# Patient Record
Sex: Female | Born: 1975 | Hispanic: No | Smoking: Current every day smoker
Health system: Southern US, Community
[De-identification: ages and names within clinical notes are randomized; demographics above are authoritative.]

## PROBLEM LIST (undated history)

## (undated) DIAGNOSIS — M1990 Unspecified osteoarthritis, unspecified site: Secondary | ICD-10-CM

## (undated) DIAGNOSIS — D649 Anemia, unspecified: Secondary | ICD-10-CM

## (undated) DIAGNOSIS — I10 Essential (primary) hypertension: Secondary | ICD-10-CM

## (undated) HISTORY — PX: WISDOM TOOTH EXTRACTION: SHX21

## (undated) HISTORY — PX: KNEE SURGERY: SHX244

---

## 1997-10-31 ENCOUNTER — Other Ambulatory Visit: Admission: RE | Admit: 1997-10-31 | Discharge: 1997-10-31 | Payer: Self-pay | Admitting: Obstetrics and Gynecology

## 1998-01-07 ENCOUNTER — Emergency Department (HOSPITAL_COMMUNITY): Admission: EM | Admit: 1998-01-07 | Discharge: 1998-01-07 | Payer: Self-pay | Admitting: Emergency Medicine

## 1998-01-09 ENCOUNTER — Encounter: Admission: RE | Admit: 1998-01-09 | Discharge: 1998-04-09 | Payer: Self-pay | Admitting: Internal Medicine

## 1998-10-22 ENCOUNTER — Inpatient Hospital Stay (HOSPITAL_COMMUNITY): Admission: AD | Admit: 1998-10-22 | Discharge: 1998-10-22 | Payer: Self-pay | Admitting: *Deleted

## 1998-11-01 ENCOUNTER — Emergency Department (HOSPITAL_COMMUNITY): Admission: EM | Admit: 1998-11-01 | Discharge: 1998-11-01 | Payer: Self-pay

## 1998-12-05 ENCOUNTER — Emergency Department (HOSPITAL_COMMUNITY): Admission: EM | Admit: 1998-12-05 | Discharge: 1998-12-05 | Payer: Self-pay | Admitting: Emergency Medicine

## 1999-05-20 ENCOUNTER — Inpatient Hospital Stay (HOSPITAL_COMMUNITY): Admission: AD | Admit: 1999-05-20 | Discharge: 1999-05-20 | Payer: Self-pay | Admitting: Obstetrics

## 1999-05-20 ENCOUNTER — Emergency Department (HOSPITAL_COMMUNITY): Admission: EM | Admit: 1999-05-20 | Discharge: 1999-05-21 | Payer: Self-pay | Admitting: Emergency Medicine

## 1999-05-20 ENCOUNTER — Encounter: Payer: Self-pay | Admitting: Emergency Medicine

## 1999-08-20 ENCOUNTER — Emergency Department (HOSPITAL_COMMUNITY): Admission: EM | Admit: 1999-08-20 | Discharge: 1999-08-20 | Payer: Self-pay | Admitting: Emergency Medicine

## 1999-12-22 ENCOUNTER — Emergency Department (HOSPITAL_COMMUNITY): Admission: EM | Admit: 1999-12-22 | Discharge: 1999-12-22 | Payer: Self-pay | Admitting: Emergency Medicine

## 1999-12-22 ENCOUNTER — Encounter: Payer: Self-pay | Admitting: Emergency Medicine

## 2000-12-07 ENCOUNTER — Emergency Department (HOSPITAL_COMMUNITY): Admission: EM | Admit: 2000-12-07 | Discharge: 2000-12-07 | Payer: Self-pay | Admitting: Emergency Medicine

## 2001-03-03 ENCOUNTER — Emergency Department (HOSPITAL_COMMUNITY): Admission: EM | Admit: 2001-03-03 | Discharge: 2001-03-03 | Payer: Self-pay | Admitting: Emergency Medicine

## 2001-03-30 ENCOUNTER — Other Ambulatory Visit: Admission: RE | Admit: 2001-03-30 | Discharge: 2001-03-30 | Payer: Self-pay | Admitting: Obstetrics and Gynecology

## 2001-06-14 ENCOUNTER — Emergency Department (HOSPITAL_COMMUNITY): Admission: EM | Admit: 2001-06-14 | Discharge: 2001-06-14 | Payer: Self-pay | Admitting: Emergency Medicine

## 2001-06-14 ENCOUNTER — Encounter: Payer: Self-pay | Admitting: Emergency Medicine

## 2001-06-15 ENCOUNTER — Emergency Department (HOSPITAL_COMMUNITY): Admission: EM | Admit: 2001-06-15 | Discharge: 2001-06-15 | Payer: Self-pay | Admitting: Emergency Medicine

## 2001-07-14 ENCOUNTER — Emergency Department (HOSPITAL_COMMUNITY): Admission: EM | Admit: 2001-07-14 | Discharge: 2001-07-14 | Payer: Self-pay | Admitting: Emergency Medicine

## 2001-09-17 ENCOUNTER — Emergency Department (HOSPITAL_COMMUNITY): Admission: EM | Admit: 2001-09-17 | Discharge: 2001-09-17 | Payer: Self-pay | Admitting: Emergency Medicine

## 2003-07-04 ENCOUNTER — Emergency Department (HOSPITAL_COMMUNITY): Admission: EM | Admit: 2003-07-04 | Discharge: 2003-07-04 | Payer: Self-pay | Admitting: Emergency Medicine

## 2004-09-26 ENCOUNTER — Emergency Department (HOSPITAL_COMMUNITY): Admission: EM | Admit: 2004-09-26 | Discharge: 2004-09-26 | Payer: Self-pay | Admitting: Emergency Medicine

## 2005-02-16 ENCOUNTER — Emergency Department (HOSPITAL_COMMUNITY): Admission: EM | Admit: 2005-02-16 | Discharge: 2005-02-16 | Payer: Self-pay | Admitting: Emergency Medicine

## 2005-03-01 ENCOUNTER — Emergency Department (HOSPITAL_COMMUNITY): Admission: EM | Admit: 2005-03-01 | Discharge: 2005-03-01 | Payer: Self-pay | Admitting: Family Medicine

## 2005-04-24 ENCOUNTER — Emergency Department (HOSPITAL_COMMUNITY): Admission: EM | Admit: 2005-04-24 | Discharge: 2005-04-24 | Payer: Self-pay | Admitting: Emergency Medicine

## 2007-08-06 ENCOUNTER — Emergency Department (HOSPITAL_COMMUNITY): Admission: EM | Admit: 2007-08-06 | Discharge: 2007-08-06 | Payer: Self-pay | Admitting: Emergency Medicine

## 2007-09-16 ENCOUNTER — Emergency Department: Payer: Self-pay | Admitting: Internal Medicine

## 2008-01-01 ENCOUNTER — Emergency Department: Payer: Self-pay | Admitting: Emergency Medicine

## 2008-01-03 ENCOUNTER — Emergency Department: Payer: Self-pay | Admitting: Internal Medicine

## 2008-01-24 ENCOUNTER — Ambulatory Visit: Payer: Self-pay | Admitting: Specialist

## 2008-02-24 ENCOUNTER — Ambulatory Visit: Payer: Self-pay | Admitting: Specialist

## 2008-04-06 ENCOUNTER — Emergency Department (HOSPITAL_COMMUNITY): Admission: EM | Admit: 2008-04-06 | Discharge: 2008-04-06 | Payer: Self-pay | Admitting: Emergency Medicine

## 2008-04-14 ENCOUNTER — Emergency Department (HOSPITAL_COMMUNITY): Admission: EM | Admit: 2008-04-14 | Discharge: 2008-04-14 | Payer: Self-pay | Admitting: Emergency Medicine

## 2008-09-28 ENCOUNTER — Emergency Department (HOSPITAL_COMMUNITY): Admission: EM | Admit: 2008-09-28 | Discharge: 2008-09-28 | Payer: Self-pay | Admitting: Emergency Medicine

## 2009-03-05 ENCOUNTER — Emergency Department (HOSPITAL_COMMUNITY): Admission: EM | Admit: 2009-03-05 | Discharge: 2009-03-05 | Payer: Self-pay | Admitting: Emergency Medicine

## 2009-03-24 ENCOUNTER — Emergency Department (HOSPITAL_COMMUNITY): Admission: EM | Admit: 2009-03-24 | Discharge: 2009-03-24 | Payer: Self-pay | Admitting: Emergency Medicine

## 2010-07-26 ENCOUNTER — Emergency Department (HOSPITAL_COMMUNITY)
Admission: EM | Admit: 2010-07-26 | Discharge: 2010-07-26 | Disposition: A | Discharge status: Home / Self Care | Payer: Self-pay | Attending: Emergency Medicine | Admitting: Emergency Medicine

## 2010-07-26 DIAGNOSIS — IMO0001 Reserved for inherently not codable concepts without codable children: Secondary | ICD-10-CM | POA: Insufficient documentation

## 2010-07-26 DIAGNOSIS — M25569 Pain in unspecified knee: Secondary | ICD-10-CM | POA: Insufficient documentation

## 2010-07-26 DIAGNOSIS — J3489 Other specified disorders of nose and nasal sinuses: Secondary | ICD-10-CM | POA: Insufficient documentation

## 2010-07-26 DIAGNOSIS — R509 Fever, unspecified: Secondary | ICD-10-CM | POA: Insufficient documentation

## 2010-07-26 DIAGNOSIS — R6883 Chills (without fever): Secondary | ICD-10-CM | POA: Insufficient documentation

## 2010-08-07 LAB — URINALYSIS, ROUTINE W REFLEX MICROSCOPIC
Bilirubin Urine: NEGATIVE
Bilirubin Urine: NEGATIVE
Glucose, UA: NEGATIVE mg/dL
Glucose, UA: NEGATIVE mg/dL
Hgb urine dipstick: NEGATIVE
Ketones, ur: NEGATIVE mg/dL
Ketones, ur: NEGATIVE mg/dL
Leukocytes, UA: NEGATIVE
Nitrite: NEGATIVE
Nitrite: NEGATIVE
Protein, ur: NEGATIVE mg/dL
Protein, ur: NEGATIVE mg/dL
Specific Gravity, Urine: 1.027 (ref 1.005–1.030)
Specific Gravity, Urine: 1.029 (ref 1.005–1.030)
Urobilinogen, UA: 2 mg/dL — ABNORMAL HIGH (ref 0.0–1.0)
Urobilinogen, UA: 1 mg/dL (ref 0.0–1.0)
pH: 6.5 (ref 5.0–8.0)
pH: 6 (ref 5.0–8.0)

## 2010-08-07 LAB — DIFFERENTIAL
Basophils Absolute: 0 10*3/uL (ref 0.0–0.1)
Basophils Relative: 0 % (ref 0–1)
Eosinophils Absolute: 0 10*3/uL (ref 0.0–0.7)
Eosinophils Relative: 1 % (ref 0–5)
Monocytes Absolute: 0.5 10*3/uL (ref 0.1–1.0)
Monocytes Relative: 17 % — ABNORMAL HIGH (ref 3–12)

## 2010-08-07 LAB — CBC
HCT: 31.8 % — ABNORMAL LOW (ref 36.0–46.0)
Hemoglobin: 11 g/dL — ABNORMAL LOW (ref 12.0–15.0)
MCHC: 34.6 g/dL (ref 30.0–36.0)
MCV: 82.6 fL (ref 78.0–100.0)
Platelets: 278 10*3/uL (ref 150–400)
RBC: 3.85 MIL/uL — ABNORMAL LOW (ref 3.87–5.11)
RDW: 14.3 % (ref 11.5–15.5)
WBC: 3 10*3/uL — ABNORMAL LOW (ref 4.0–10.5)

## 2010-08-07 LAB — URINE MICROSCOPIC-ADD ON

## 2010-08-07 LAB — POCT I-STAT, CHEM 8
BUN: 13 mg/dL (ref 6–23)
Calcium, Ion: 1.19 mmol/L (ref 1.12–1.32)
Chloride: 103 mEq/L (ref 96–112)
Creatinine, Ser: 0.5 mg/dL (ref 0.4–1.2)
Glucose, Bld: 95 mg/dL (ref 70–99)
HCT: 34 % — ABNORMAL LOW (ref 36.0–46.0)
Hemoglobin: 11.6 g/dL — ABNORMAL LOW (ref 12.0–15.0)
Potassium: 3.8 mEq/L (ref 3.5–5.1)
Sodium: 141 mEq/L (ref 135–145)
TCO2: 27 mmol/L (ref 0–100)

## 2010-08-07 LAB — POCT PREGNANCY, URINE: Preg Test, Ur: NEGATIVE

## 2010-08-07 LAB — GLUCOSE, CAPILLARY: Glucose-Capillary: 96 mg/dL (ref 70–99)

## 2010-10-01 ENCOUNTER — Emergency Department (HOSPITAL_COMMUNITY)
Admission: EM | Admit: 2010-10-01 | Discharge: 2010-10-02 | Disposition: A | Discharge status: Home / Self Care | Payer: Self-pay | Attending: Pediatric Emergency Medicine | Admitting: Pediatric Emergency Medicine

## 2010-10-01 DIAGNOSIS — D649 Anemia, unspecified: Secondary | ICD-10-CM | POA: Insufficient documentation

## 2010-10-01 DIAGNOSIS — R079 Chest pain, unspecified: Secondary | ICD-10-CM | POA: Insufficient documentation

## 2010-10-01 DIAGNOSIS — R209 Unspecified disturbances of skin sensation: Secondary | ICD-10-CM | POA: Insufficient documentation

## 2010-10-01 DIAGNOSIS — R0609 Other forms of dyspnea: Secondary | ICD-10-CM | POA: Insufficient documentation

## 2010-10-01 DIAGNOSIS — R0989 Other specified symptoms and signs involving the circulatory and respiratory systems: Secondary | ICD-10-CM | POA: Insufficient documentation

## 2010-10-01 DIAGNOSIS — E669 Obesity, unspecified: Secondary | ICD-10-CM | POA: Insufficient documentation

## 2010-10-02 ENCOUNTER — Emergency Department (HOSPITAL_COMMUNITY): Payer: Self-pay

## 2010-10-02 LAB — D-DIMER, QUANTITATIVE: D-Dimer, Quant: 0.53 ug/mL-FEU — ABNORMAL HIGH (ref 0.00–0.48)

## 2010-10-02 LAB — DIFFERENTIAL
Basophils Relative: 0 % (ref 0–1)
Eosinophils Absolute: 0 10*3/uL (ref 0.0–0.7)
Eosinophils Relative: 1 % (ref 0–5)
Monocytes Absolute: 0.3 10*3/uL (ref 0.1–1.0)
Monocytes Relative: 7 % (ref 3–12)
Neutro Abs: 1.5 10*3/uL — ABNORMAL LOW (ref 1.7–7.7)

## 2010-10-02 LAB — CBC
Hemoglobin: 10.8 g/dL — ABNORMAL LOW (ref 12.0–15.0)
MCH: 26.2 pg (ref 26.0–34.0)
MCHC: 33.2 g/dL (ref 30.0–36.0)
RDW: 13.7 % (ref 11.5–15.5)

## 2010-10-02 LAB — TROPONIN I: Troponin I: <0.3 ng/mL (ref <0.30)

## 2010-10-02 LAB — BASIC METABOLIC PANEL
BUN: 11 mg/dL (ref 6–23)
Chloride: 102 mEq/L (ref 96–112)
Potassium: 3 mEq/L — ABNORMAL LOW (ref 3.5–5.1)

## 2010-10-02 MED ORDER — IOHEXOL 350 MG/ML SOLN
100.0000 mL | Freq: Once | INTRAVENOUS | Status: AC | PRN
  Administered 2010-10-02: 100 mL via INTRAVENOUS

## 2011-02-06 LAB — RAPID STREP SCREEN (MED CTR MEBANE ONLY): Streptococcus, Group A Screen (Direct): NEGATIVE

## 2011-03-31 IMAGING — CT CT HEAD W/O CM
1 of 2 series · 13 of 30 positions shown, 17 images · non-contrast
Comparison: None

CLINICAL DATA: Pain and dizziness

CT HEAD WITHOUT CONTRAST
TECHNIQUE: Contiguous axial images were obtained from the base of
the skull through the vertex without contrast.

[Series 2: brain · axial · 0.47mm/px · z∈[+127,+252]mm · 13 of 28 slices shown, 17 images]
[im 2/28  brain]
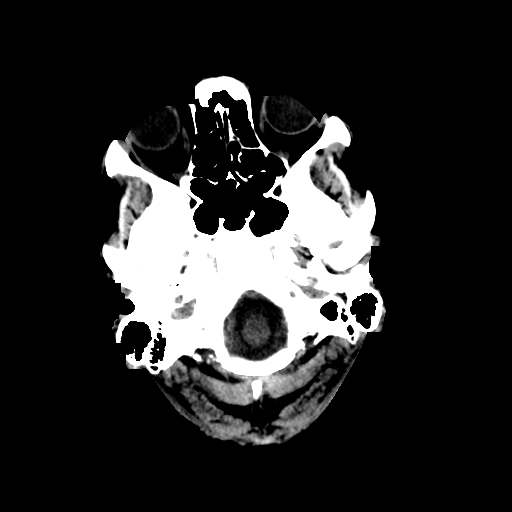
[im 2/28  bone]
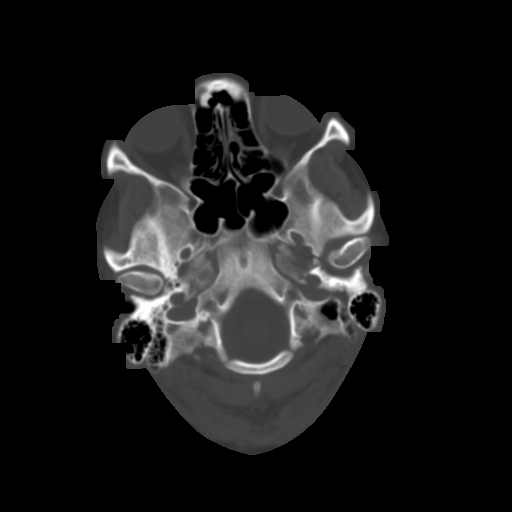
[im 4/28  brain]
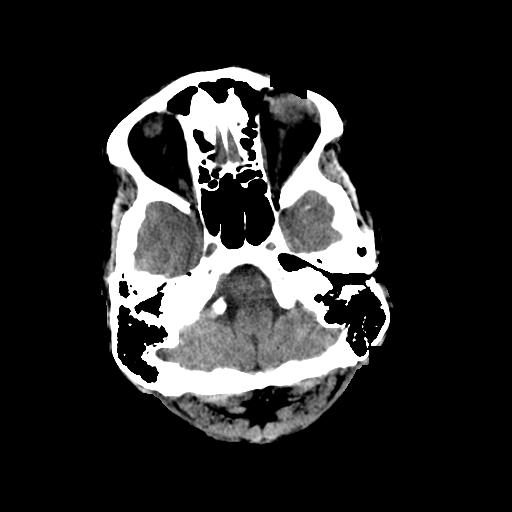
[im 6/28  brain]
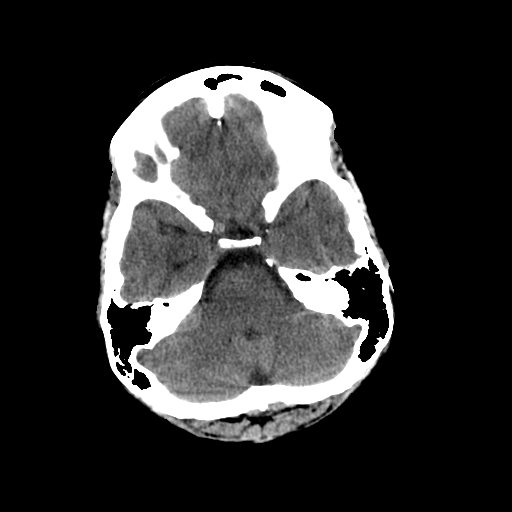
[im 8/28  brain]
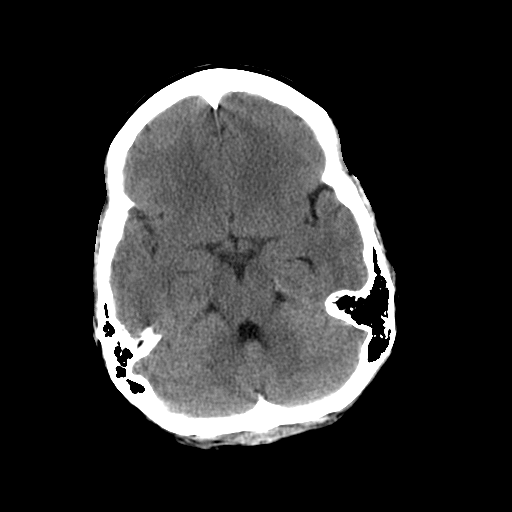
[im 10/28  brain]
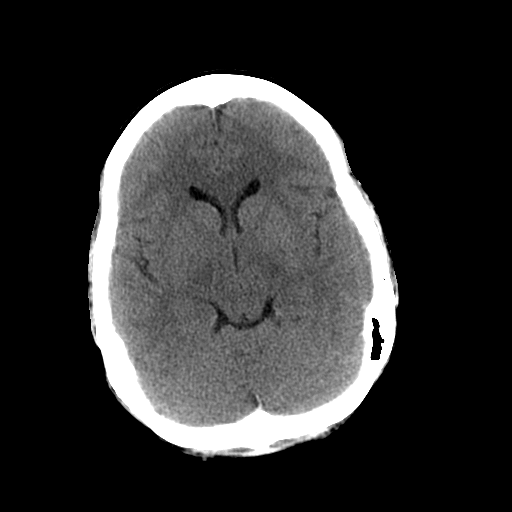
[im 10/28  bone]
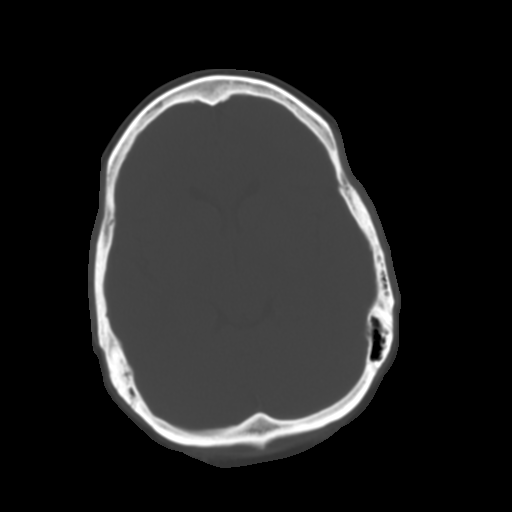
[im 12/28  brain]
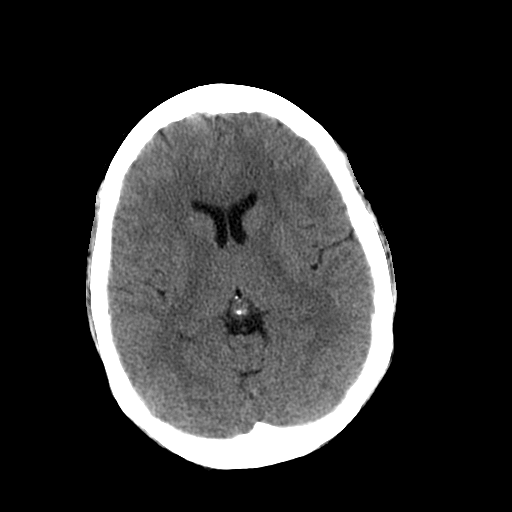
[im 14/28  brain]
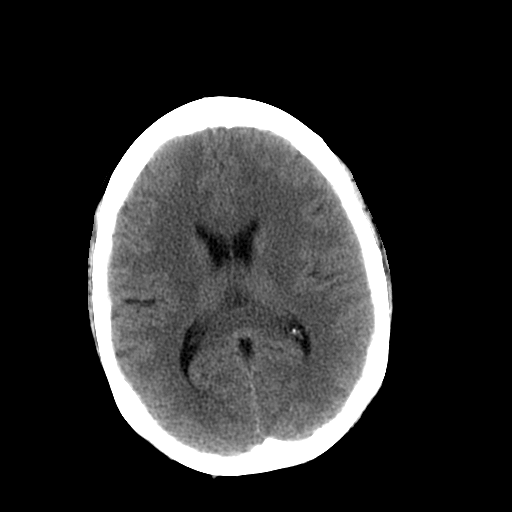
[im 16/28  brain]
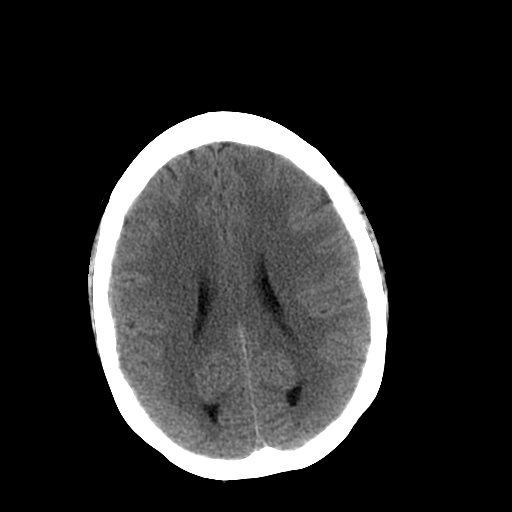
[im 18/28  brain]
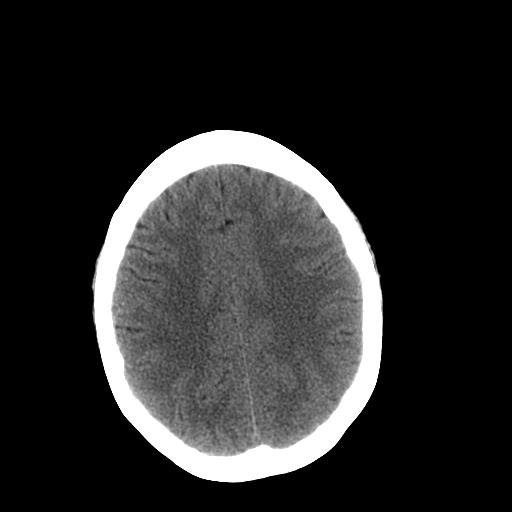
[im 18/28  bone]
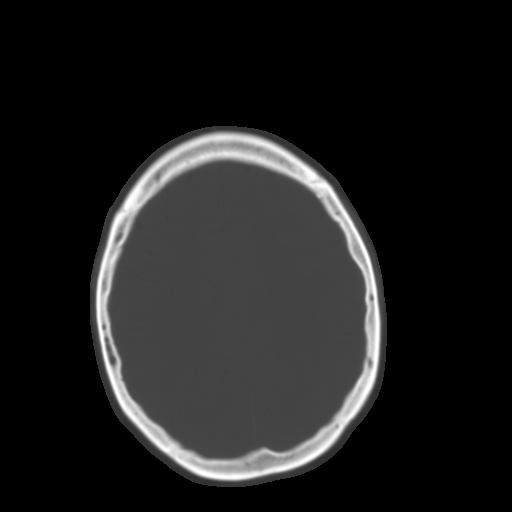
[im 20/28  brain]
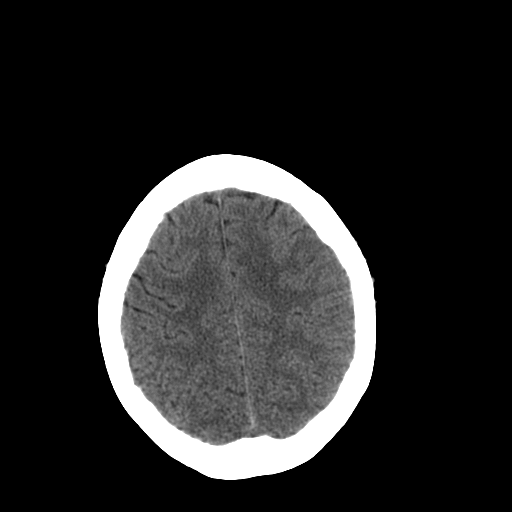
[im 22/28  brain]
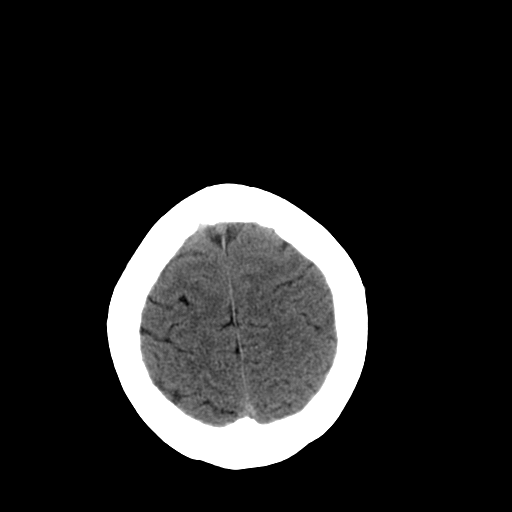
[im 24/28  brain]
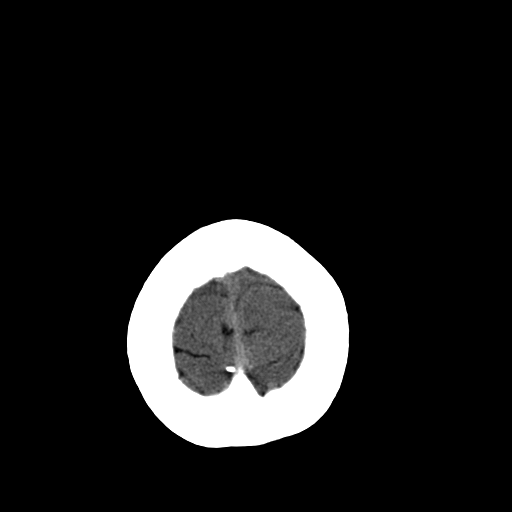
[im 26/28  brain]
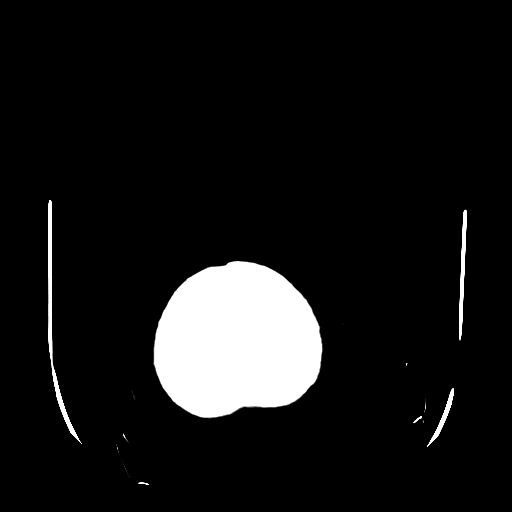
[im 26/28  bone]
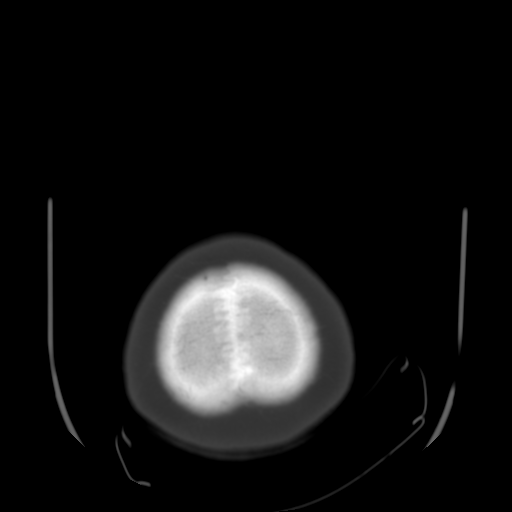

[13 of 30 positions shown; findings below may reference images not displayed]

FINDINGS: No depressed skull fracture is noted.  The paranasal
sinuses and mastoid air cells are unremarkable.

No acute infarction.  No mass lesion is noted.  No intra or extra-
axial fluid collection.  No hydrocephalus.

No intracranial hemorrhage, mass effect or midline shift.
IMPRESSION: No acute intracranial abnormality.

## 2011-06-29 ENCOUNTER — Emergency Department (HOSPITAL_COMMUNITY)
Admission: EM | Admit: 2011-06-29 | Discharge: 2011-06-29 | Disposition: A | Discharge status: Home / Self Care | Payer: Self-pay | Attending: Emergency Medicine | Admitting: Emergency Medicine

## 2011-06-29 ENCOUNTER — Encounter (HOSPITAL_COMMUNITY): Payer: Self-pay | Admitting: Adult Health

## 2011-06-29 ENCOUNTER — Emergency Department (HOSPITAL_COMMUNITY): Payer: Self-pay

## 2011-06-29 DIAGNOSIS — R112 Nausea with vomiting, unspecified: Secondary | ICD-10-CM | POA: Insufficient documentation

## 2011-06-29 DIAGNOSIS — R197 Diarrhea, unspecified: Secondary | ICD-10-CM | POA: Insufficient documentation

## 2011-06-29 DIAGNOSIS — R0602 Shortness of breath: Secondary | ICD-10-CM | POA: Insufficient documentation

## 2011-06-29 DIAGNOSIS — J069 Acute upper respiratory infection, unspecified: Secondary | ICD-10-CM | POA: Insufficient documentation

## 2011-06-29 DIAGNOSIS — J309 Allergic rhinitis, unspecified: Secondary | ICD-10-CM | POA: Insufficient documentation

## 2011-06-29 DIAGNOSIS — Z9109 Other allergy status, other than to drugs and biological substances: Secondary | ICD-10-CM

## 2011-06-29 DIAGNOSIS — K130 Diseases of lips: Secondary | ICD-10-CM | POA: Insufficient documentation

## 2011-06-29 HISTORY — DX: Unspecified osteoarthritis, unspecified site: M19.90

## 2011-06-29 HISTORY — DX: Anemia, unspecified: D64.9

## 2011-06-29 LAB — COMPREHENSIVE METABOLIC PANEL
Alkaline Phosphatase: 74 U/L (ref 39–117)
BUN: 18 mg/dL (ref 6–23)
CO2: 28 mEq/L (ref 19–32)
Chloride: 105 mEq/L (ref 96–112)
GFR calc Af Amer: >90 mL/min (ref >90)
GFR calc non Af Amer: >90 mL/min (ref >90)
Glucose, Bld: 115 mg/dL — ABNORMAL HIGH (ref 70–99)
Potassium: 3.9 mEq/L (ref 3.5–5.1)
Total Bilirubin: 0.1 mg/dL — ABNORMAL LOW (ref 0.3–1.2)
Total Protein: 6.8 g/dL (ref 6.0–8.3)

## 2011-06-29 LAB — URINALYSIS, ROUTINE W REFLEX MICROSCOPIC
Bilirubin Urine: NEGATIVE
Hgb urine dipstick: NEGATIVE
Ketones, ur: NEGATIVE mg/dL
Nitrite: NEGATIVE
Urobilinogen, UA: 2 mg/dL — ABNORMAL HIGH (ref 0.0–1.0)

## 2011-06-29 LAB — CBC
Hemoglobin: 10.2 g/dL — ABNORMAL LOW (ref 12.0–15.0)
MCH: 26.9 pg (ref 26.0–34.0)
RBC: 3.79 MIL/uL — ABNORMAL LOW (ref 3.87–5.11)

## 2011-06-29 LAB — DIFFERENTIAL
Eosinophils Absolute: 0.1 10*3/uL (ref 0.0–0.7)
Lymphocytes Relative: 53 % — ABNORMAL HIGH (ref 12–46)
Lymphs Abs: 1.6 10*3/uL (ref 0.7–4.0)
Monocytes Relative: 11 % (ref 3–12)
Neutrophils Relative %: 35 % — ABNORMAL LOW (ref 43–77)

## 2011-06-29 LAB — LIPASE, BLOOD: Lipase: 20 U/L (ref 11–59)

## 2011-06-29 MED ORDER — SODIUM CHLORIDE 0.9 % IV BOLUS (SEPSIS)
1000.0000 mL | Freq: Once | INTRAVENOUS | Status: AC
  Administered 2011-06-29: 1000 mL via INTRAVENOUS

## 2011-06-29 MED ORDER — ONDANSETRON HCL 4 MG/2ML IJ SOLN
4.0000 mg | Freq: Once | INTRAMUSCULAR | Status: AC
  Administered 2011-06-29: 4 mg via INTRAVENOUS

## 2011-06-29 MED ORDER — CALCIUM CARBONATE ANTACID 500 MG PO CHEW: 1.0000 | CHEWABLE_TABLET | Freq: Every day | ORAL | Status: AC

## 2011-06-29 MED ORDER — TETANUS-DIPHTH-ACELL PERTUSSIS 5-2.5-18.5 LF-MCG/0.5 IM SUSP
0.5000 mL | Freq: Once | INTRAMUSCULAR | Status: AC
  Administered 2011-06-29: 0.5 mL via INTRAMUSCULAR

## 2011-06-29 MED ORDER — CETIRIZINE-PSEUDOEPHEDRINE ER 5-120 MG PO TB12: 1.0000 | ORAL_TABLET | Freq: Every day | ORAL | Status: AC

## 2011-06-29 MED ORDER — MORPHINE SULFATE 4 MG/ML IJ SOLN
4.0000 mg | Freq: Once | INTRAMUSCULAR | Status: AC
  Administered 2011-06-29: 4 mg via INTRAVENOUS

## 2011-06-29 NOTE — ED Provider Notes (Signed)
Medical screening examination/treatment/procedure(s) were performed by non-physician practitioner and as supervising physician I was immediately available for consultation/collaboration.   Nat Christen, MD 06/29/11 [phone]

## 2011-06-29 NOTE — ED Provider Notes (Signed)
History     CSN: [id number]  Arrival date & time 06/29/11  0810   First MD Initiated Contact with Patient 06/29/11 0830      Chief Complaint  Patient presents with  . Diarrhea  . URI  . Fever    (Consider location/radiation/quality/duration/timing/severity/associated sxs/prior treatment) HPI  36 year old female presenting to the ED with chief complaints of flulike symptoms. States she recently started a new job working at the [employer] 2 weeks ago. Ever since starting her new job she has been experiencing headache, sore throat, runny nose, sneezing, cough productive with streaks of bright red blood, abdominal discomfort, nausea occasional vomiting and occasional diarrhea. Patient denies fever, neck stiffness, rash.  Any recent antibiotic use, or any medication changes. She has been trying counter medication for flulike symptoms without relief.  Past Medical History  Diagnosis Date  . Anemia   . Arthritis     Past Surgical History  Procedure Date  . Knee surgery     History reviewed. No pertinent family history.  History  Substance Use Topics  . Smoking status: Current Everyday Smoker  . Smokeless tobacco: Not on file  . Alcohol Use: Yes    OB History    Grav Para Term Preterm Abortions TAB SAB Ect Mult Living                  Review of Systems  All other systems reviewed and are negative.    Allergies  Penicillins  Home Medications   Current Outpatient Rx  Name Route Sig Dispense Refill  . PHENYLEPHRINE-DM-GG-APAP 5-10-200-325 MG PO TABS Oral Take 2 tablets by mouth every 4 (four) hours as needed. For cold      BP 161/91  Pulse 101  Temp 98 F (36.7 C)  Resp 16  SpO2 100%  Physical Exam  Nursing note and vitals reviewed. Constitutional: She is oriented to person, place, and time. She appears well-developed and well-nourished. No distress.       Awake, alert, nontoxic appearance  HENT:  Head: Normocephalic and atraumatic.    Right Ear:  External ear normal.  Left Ear: External ear normal.  Mouth/Throat: Oropharynx is clear and moist.  Eyes: Conjunctivae are normal. Right eye exhibits no discharge. Left eye exhibits no discharge.  Neck: Neck supple.  Cardiovascular: Normal rate and regular rhythm.   Pulmonary/Chest: Effort normal. No respiratory distress. She exhibits no tenderness.  Abdominal: Soft. There is no tenderness. There is no rebound.  Musculoskeletal: She exhibits no tenderness.       ROM appears intact, no obvious focal weakness  Neurological: She is alert and oriented to person, place, and time.       Mental status and motor strength appears intact  Skin: Skin is warm and dry. No rash noted.  Psychiatric: She has a normal mood and affect.    ED Course  Procedures (including critical care time)  Labs Reviewed - No data to display No results found.   No diagnosis found.  Results for orders placed during the hospital encounter of 06/29/11  CBC      Component Value Range   WBC 3.1 (*) 4.0 - 10.5 (K/uL)   RBC 3.79 (*) 3.87 - 5.11 (MIL/uL)   Hemoglobin 10.2 (*) 12.0 - 15.0 (g/dL)   HCT 46.9 (*) 62.9 - 46.0 (%)   MCV 81.0  78.0 - 100.0 (fL)   MCH 26.9  26.0 - 34.0 (pg)   MCHC 33.2  30.0 - 36.0 (g/dL)  RDW 13.8  11.5 - 15.5 (%)   Platelets 305  150 - 400 (K/uL)  DIFFERENTIAL      Component Value Range   Neutrophils Relative 35 (*) 43 - 77 (%)   Neutro Abs 1.1 (*) 1.7 - 7.7 (K/uL)   Lymphocytes Relative 53 (*) 12 - 46 (%)   Lymphs Abs 1.6  0.7 - 4.0 (K/uL)   Monocytes Relative 11  3 - 12 (%)   Monocytes Absolute 0.3  0.1 - 1.0 (K/uL)   Eosinophils Relative 2  0 - 5 (%)   Eosinophils Absolute 0.1  0.0 - 0.7 (K/uL)   Basophils Relative 0  0 - 1 (%)   Basophils Absolute 0.0  0.0 - 0.1 (K/uL)  COMPREHENSIVE METABOLIC PANEL      Component Value Range   Sodium 139  135 - 145 (mEq/L)   Potassium 3.9  3.5 - 5.1 (mEq/L)   Chloride 105  96 - 112 (mEq/L)   CO2 28  19 - 32 (mEq/L)   Glucose, Bld 115  (*) 70 - 99 (mg/dL)   BUN 18  6 - 23 (mg/dL)   Creatinine, Ser 1.61 (*) 0.50 - 1.10 (mg/dL)   Calcium 8.6  8.4 - 09.6 (mg/dL)   Total Protein 6.8  6.0 - 8.3 (g/dL)   Albumin 2.9 (*) 3.5 - 5.2 (g/dL)   AST 17  0 - 37 (U/L)   ALT 21  0 - 35 (U/L)   Alkaline Phosphatase 74  39 - 117 (U/L)   Total Bilirubin 0.1 (*) 0.3 - 1.2 (mg/dL)   GFR calc non Af Amer >90  >90 (mL/min)   GFR calc Af Amer >90  >90 (mL/min)  LIPASE, BLOOD      Component Value Range   Lipase 20  11 - 59 (U/L)  URINALYSIS, ROUTINE W REFLEX MICROSCOPIC      Component Value Range   Color, Urine YELLOW  YELLOW    APPearance CLEAR  CLEAR    Specific Gravity, Urine 1.030  1.005 - 1.030    pH 7.0  5.0 - 8.0    Glucose, UA NEGATIVE  NEGATIVE (mg/dL)   Hgb urine dipstick NEGATIVE  NEGATIVE    Bilirubin Urine NEGATIVE  NEGATIVE    Ketones, ur NEGATIVE  NEGATIVE (mg/dL)   Protein, ur NEGATIVE  NEGATIVE (mg/dL)   Urobilinogen, UA 2.0 (*) 0.0 - 1.0 (mg/dL)   Nitrite NEGATIVE  NEGATIVE    Leukocytes, UA NEGATIVE  NEGATIVE   PREGNANCY, URINE      Component Value Range   Preg Test, Ur NEGATIVE  NEGATIVE    Dg Chest 2 View  06/29/2011  *RADIOLOGY REPORT*  Clinical Data: 36 year old female with shortness of breath and cough.  CHEST - 2 VIEW  Comparison: 10/02/2010  Findings: The cardiomediastinal silhouette is unremarkable. The lungs are clear. There is no evidence of focal airspace disease, pulmonary edema, suspicious pulmonary nodule/mass, pleural effusion, or pneumothorax. No acute bony abnormalities are identified.  IMPRESSION: No evidence of active cardiopulmonary disease.  Original Report Authenticated By: Rosendo Gros, M.D.      MDM  Issue with flulike symptoms. Afebrile she is in no acute respiratory distress. Abdomen is benign on exam. Plan to rehydrate her, give pain medication antiemetic.  Will obtain CXR for cough and hemoptysis.  UA and urpreg ordered.    9:59 AM Patient's workup was essentially negative today.  She does not appears to be dehydrated based on her electrolytes. She does have low white  count, low neutrophil and mild anemia but this is likely at her baseline. Chest x-ray is essentially negative is normal urine and she is not pregnant. Symptoms likely secondary to allergies with URI.  I will give pt referral f/u.  Will prescribe Zyrtec and H2 blockers.  Reassurance given.  Discussed care with my attending.  Pt is able to tolerates PO.  Will discharge.        Fayrene Helper, PA-C 06/29/11 1002

## 2011-06-29 NOTE — Discharge Instructions (Signed)
Allergic Reaction Allergic reactions can be caused by anything your body is sensitive to. Your body may be sensitive to food, medicines, molds, pollens, cockroaches, dust mites, pets, insect stings, and other things around you. An allergic reaction may cause puffiness (swelling), itching, sneezing, coughing, or problems breathing.  Allergies cannot be cured, but they can be controlled with medicine. Some allergies happen only at certain times of the year. Try to stay away from what causes your reaction if possible. Sometimes, it is hard to tell what causes your reaction. HOME CARE If you have a rash or red patches (hives) on your skin:  Take medicines as told by your doctor.   Do not drive or drink alcohol after taking medicines. They can make you sleepy.   Put cold cloths on your skin. Take baths in cool water. This will help your itching. Do not take hot baths or showers. Heat will make the itching worse.   If your allergies get worse, your doctor might give you other medicines. Talk to your doctor if problems continue.  GET HELP RIGHT AWAY IF:   You have trouble breathing.   You have a tight feeling in your chest or throat.   Your mouth gets puffy (swollen).   You have red, itchy patches on your skin (hives) that get worse.   You have itching all over your body.  MAKE SURE YOU:   Understand these instructions.   Will watch your condition.   Will get help right away if you are not doing well or get worse.  Document Released: 04/09/2009 Document Revised: 01/01/2011 Document Reviewed: 04/09/2009 [facility] Patient Information 2012 [place], [place].Upper Respiratory Infection, Adult An upper respiratory infection (URI) is also known as the common cold. It is often caused by a type of germ (virus). Colds are easily spread (contagious). You can pass it to others by kissing, coughing, sneezing, or drinking out of the same glass. Usually, you get better in 1 or 2 weeks.  HOME CARE   Only  take medicine as told by your doctor.   Use a warm mist humidifier or breathe in steam from a hot shower.   Drink enough water and fluids to keep your pee (urine) clear or pale yellow.   Get plenty of rest.   Return to work when your temperature is back to normal or as told by your doctor. You may use a face mask and wash your hands to stop your cold from spreading.  GET HELP RIGHT AWAY IF:   After the first few days, you feel you are getting worse.   You have questions about your medicine.   You have chills, shortness of breath, or brown or red spit (mucus).   You have yellow or brown snot (nasal discharge) or pain in the face, especially when you bend forward.   You have a fever, puffy (swollen) neck, pain when you swallow, or white spots in the back of your throat.   You have a bad headache, ear pain, sinus pain, or chest pain.   You have a high-pitched whistling sound when you breathe in and out (wheezing).   You have a lasting cough or cough up blood.   You have sore muscles or a stiff neck.  MAKE SURE YOU:   Understand these instructions.   Will watch your condition.   Will get help right away if you are not doing well or get worse.  Document Released: 10/08/2007 Document Revised: 01/01/2011 Document Reviewed: 08/26/2010 [employer] Patient Information  [address], [place].   RESOURCE GUIDE  Dental Problems  Patients with Medicaid: [facility]                     [phone] [address]                                           Phone:  [phone]                                                  If unable to pay or uninsured, contact:  [facility] or [facility] to become qualified for the adult dental clinic.  Chronic Pain Problems Contact [facility]  [phone] Patients need to be referred by their primary care doctor.  Insufficient Money for Medicine Contact [employer]:  call "211" or [facility]  850-530-1177.  No Primary Care Doctor Call [facility]  [phone] Other agencies that provide inexpensive medical care    [facility]  [phone]    [facility]  [phone]    [facility]  [phone]    [facility]  [phone]    [facility]  9064109633    [facility]  [phone]  Substance Abuse Resources [facility]  [phone] [facility] 971-007-1435 [facility] [place] [phone] Floydene Flock [phone] Residential & Outpatient Substance Abuse Program  [phone]  Psychological Services [facility]  [phone] [facility]  [phone] [facility]   [phone] (emergency services ([phone])  Abuse/Neglect [facility] Child Abuse Hotline ([phone] [facility] Child Abuse Hotline (973) 633-1015 (After Hours)  Emergency Shelter [facility] 6026935100  Maternity Homes Room at the [place] [facility] [place] [phone] 808-2317 [facility] [phone]  MRSA Hotline #:   479-665-1530    [facility] Resources  [facility]  [employer]                           [facility] Dept. [address] [place]                     [address]         [address]  Blondell Reveal Phone:  [phone]                                  Phone:  [phone]                   Phone:  [phone]  [facility] Phone:  [phone]  [facility] Child  Abuse Hotline (336) [phone] (After Hours)

## 2011-06-29 NOTE — ED Notes (Signed)
Pt given discharge instructions and rx, verb understanding, amb indep to discharge window 

## 2011-06-29 NOTE — ED Notes (Signed)
Presents with diarrhea, abdominal pain, nausea, fever and runny nose since Feb 16th.

## 2011-06-29 NOTE — ED Notes (Signed)
Patient transported to X-ray 

## 2014-09-26 ENCOUNTER — Emergency Department: Payer: Federal, State, Local not specified - PPO

## 2014-09-26 ENCOUNTER — Emergency Department
Admission: EM | Admit: 2014-09-26 | Discharge: 2014-09-26 | Disposition: A | Discharge status: Home / Self Care | Payer: Federal, State, Local not specified - PPO | Attending: Student | Admitting: Student

## 2014-09-26 ENCOUNTER — Encounter: Payer: Self-pay | Admitting: Urgent Care

## 2014-09-26 DIAGNOSIS — Z880 Allergy status to penicillin: Secondary | ICD-10-CM | POA: Insufficient documentation

## 2014-09-26 DIAGNOSIS — R42 Dizziness and giddiness: Secondary | ICD-10-CM | POA: Insufficient documentation

## 2014-09-26 DIAGNOSIS — Y92410 Unspecified street and highway as the place of occurrence of the external cause: Secondary | ICD-10-CM | POA: Insufficient documentation

## 2014-09-26 DIAGNOSIS — S86912A Strain of unspecified muscle(s) and tendon(s) at lower leg level, left leg, initial encounter: Secondary | ICD-10-CM | POA: Insufficient documentation

## 2014-09-26 DIAGNOSIS — Z720 Tobacco use: Secondary | ICD-10-CM | POA: Insufficient documentation

## 2014-09-26 DIAGNOSIS — Y998 Other external cause status: Secondary | ICD-10-CM | POA: Insufficient documentation

## 2014-09-26 DIAGNOSIS — Y9355 Activity, bike riding: Secondary | ICD-10-CM | POA: Insufficient documentation

## 2014-09-26 DIAGNOSIS — R110 Nausea: Secondary | ICD-10-CM | POA: Insufficient documentation

## 2014-09-26 DIAGNOSIS — Y9389 Activity, other specified: Secondary | ICD-10-CM | POA: Insufficient documentation

## 2014-09-26 DIAGNOSIS — S8002XA Contusion of left knee, initial encounter: Secondary | ICD-10-CM | POA: Insufficient documentation

## 2014-09-26 MED ORDER — NABUMETONE 750 MG PO TABS
750.0000 mg | ORAL_TABLET | Freq: Two times a day (BID) | ORAL | Status: DC
Start: 2014-09-26 — End: 2015-11-15

## 2014-09-26 MED ORDER — TRAMADOL HCL 50 MG PO TABS: 50.0000 mg | ORAL_TABLET | Freq: Two times a day (BID) | ORAL | Status: DC
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
## 2014-09-26 NOTE — Discharge Instructions (Signed)
Knee Sprain A knee sprain is a tear in the strong bands of tissue that connect the bones (ligaments) of your knee. HOME CARE  Raise (elevate) your injured knee to lessen puffiness (swelling).  To ease pain and puffiness, put ice on the injured area.  Put ice in a plastic bag.  Place a towel between your skin and the bag.  Leave the ice on for 20 minutes, 2-3 times a day.  Only take medicine as told by your doctor.  Do not leave your knee unprotected until pain and stiffness go away (usually 4-6 weeks).  If you have a cast or splint, do not get it wet. If your doctor told you to not take it off, cover it with a plastic bag when you shower or bathe. Do not swim.  Your doctor may have you do exercises to prevent or limit permanent weakness and stiffness. GET HELP RIGHT AWAY IF:   Your cast or splint becomes damaged.  Your pain gets worse.  You have a lot of pain, puffiness, or numbness below the cast or splint. MAKE SURE YOU:   Understand these instructions.  Will watch your condition.  Will get help right away if you are not doing well or get worse. Document Released: 04/09/2009 Document Revised: 04/26/2013 Document Reviewed: 12/28/2012 [facility] Patient Information 2015 [place], [place]. This information is not intended to replace advice given to you by your health care provider. Make sure you discuss any questions you have with your health care provider.  Wear the knee immobilizer as needed this week for support.  Take the prescription medicine as directed. Rest with the leg elevated as needed. Follow-up with Dr.  Marry Guan for ongoing knee problems.

## 2015-11-15 ENCOUNTER — Emergency Department: Payer: Self-pay

## 2015-11-15 ENCOUNTER — Encounter: Payer: Self-pay | Admitting: Emergency Medicine

## 2015-11-15 ENCOUNTER — Inpatient Hospital Stay
Admission: EM | Admit: 2015-11-15 | Discharge: 2015-11-16 | DRG: 812 | Disposition: A | Discharge status: Home / Self Care | Payer: Self-pay | Attending: Internal Medicine | Admitting: Internal Medicine

## 2015-11-15 DIAGNOSIS — F17210 Nicotine dependence, cigarettes, uncomplicated: Secondary | ICD-10-CM | POA: Diagnosis present

## 2015-11-15 DIAGNOSIS — K219 Gastro-esophageal reflux disease without esophagitis: Secondary | ICD-10-CM | POA: Diagnosis present

## 2015-11-15 DIAGNOSIS — D500 Iron deficiency anemia secondary to blood loss (chronic): Principal | ICD-10-CM | POA: Diagnosis present

## 2015-11-15 DIAGNOSIS — Z833 Family history of diabetes mellitus: Secondary | ICD-10-CM

## 2015-11-15 DIAGNOSIS — E669 Obesity, unspecified: Secondary | ICD-10-CM | POA: Diagnosis present

## 2015-11-15 DIAGNOSIS — Z6841 Body Mass Index (BMI) 40.0 and over, adult: Secondary | ICD-10-CM

## 2015-11-15 DIAGNOSIS — D649 Anemia, unspecified: Secondary | ICD-10-CM | POA: Diagnosis present

## 2015-11-15 DIAGNOSIS — K5900 Constipation, unspecified: Secondary | ICD-10-CM | POA: Diagnosis present

## 2015-11-15 DIAGNOSIS — Z8249 Family history of ischemic heart disease and other diseases of the circulatory system: Secondary | ICD-10-CM

## 2015-11-15 DIAGNOSIS — N946 Dysmenorrhea, unspecified: Secondary | ICD-10-CM | POA: Diagnosis present

## 2015-11-15 DIAGNOSIS — F1020 Alcohol dependence, uncomplicated: Secondary | ICD-10-CM | POA: Diagnosis present

## 2015-11-15 DIAGNOSIS — D259 Leiomyoma of uterus, unspecified: Secondary | ICD-10-CM | POA: Diagnosis present

## 2015-11-15 DIAGNOSIS — N920 Excessive and frequent menstruation with regular cycle: Secondary | ICD-10-CM | POA: Diagnosis present

## 2015-11-15 DIAGNOSIS — Z9119 Patient's noncompliance with other medical treatment and regimen: Secondary | ICD-10-CM

## 2015-11-15 LAB — URINALYSIS COMPLETE WITH MICROSCOPIC (ARMC ONLY)
BILIRUBIN URINE: NEGATIVE
BILIRUBIN URINE: NEGATIVE
Bacteria, UA: NONE SEEN
Bacteria, UA: NONE SEEN
GLUCOSE, UA: NEGATIVE mg/dL
GLUCOSE, UA: NEGATIVE mg/dL
HGB URINE DIPSTICK: NEGATIVE
Hgb urine dipstick: NEGATIVE
KETONES UR: NEGATIVE mg/dL
KETONES UR: NEGATIVE mg/dL
LEUKOCYTES UA: NEGATIVE
LEUKOCYTES UA: NEGATIVE
NITRITE: NEGATIVE
Nitrite: NEGATIVE
Protein, ur: NEGATIVE mg/dL
Protein, ur: NEGATIVE mg/dL
RBC / HPF: NONE SEEN RBC/hpf (ref 0–5)
SPECIFIC GRAVITY, URINE: 1.02 (ref 1.005–1.030)
Specific Gravity, Urine: 1.014 (ref 1.005–1.030)
pH: 5 (ref 5.0–8.0)
pH: 6 (ref 5.0–8.0)

## 2015-11-15 LAB — COMPREHENSIVE METABOLIC PANEL
ALT: 13 U/L — ABNORMAL LOW (ref 14–54)
AST: 19 U/L (ref 15–41)
Albumin: 3.7 g/dL (ref 3.5–5.0)
Alkaline Phosphatase: 66 U/L (ref 38–126)
Anion gap: 8 (ref 5–15)
BUN: 13 mg/dL (ref 6–20)
CO2: 25 mmol/L (ref 22–32)
Calcium: 8.6 mg/dL — ABNORMAL LOW (ref 8.9–10.3)
Chloride: 103 mmol/L (ref 101–111)
Creatinine, Ser: 0.63 mg/dL (ref 0.44–1.00)
GFR calc Af Amer: >60 mL/min (ref >60)
GFR calc non Af Amer: >60 mL/min (ref >60)
Glucose, Bld: 109 mg/dL — ABNORMAL HIGH (ref 65–99)
Potassium: 3.8 mmol/L (ref 3.5–5.1)
Sodium: 136 mmol/L (ref 135–145)
Total Bilirubin: 0.6 mg/dL (ref 0.3–1.2)
Total Protein: 7.4 g/dL (ref 6.5–8.1)

## 2015-11-15 LAB — PREPARE RBC (CROSSMATCH)

## 2015-11-15 LAB — CBC
HCT: 23.7 % — ABNORMAL LOW (ref 35.0–47.0)
Hemoglobin: 6.7 g/dL — ABNORMAL LOW (ref 12.0–16.0)
MCH: 17.4 pg — ABNORMAL LOW (ref 26.0–34.0)
MCHC: 28.4 g/dL — ABNORMAL LOW (ref 32.0–36.0)
MCV: 61.2 fL — ABNORMAL LOW (ref 80.0–100.0)
PLATELETS: 311 10*3/uL (ref 150–440)
RBC: 3.87 MIL/uL (ref 3.80–5.20)
RDW: 18.8 % — AB (ref 11.5–14.5)
WBC: 3.9 10*3/uL (ref 3.6–11.0)

## 2015-11-15 LAB — IRON AND TIBC
IRON: 13 ug/dL — AB (ref 28–170)
SATURATION RATIOS: 2 % — AB (ref 10.4–31.8)
TIBC: 576 ug/dL — AB (ref 250–450)
UIBC: 563 ug/dL

## 2015-11-15 LAB — POCT PREGNANCY, URINE: Preg Test, Ur: NEGATIVE

## 2015-11-15 LAB — ABO/RH: ABO/RH(D): A POS

## 2015-11-15 LAB — TSH: TSH: 1.385 u[IU]/mL (ref 0.350–4.500)

## 2015-11-15 LAB — LIPASE, BLOOD: Lipase: 20 U/L (ref 11–51)

## 2015-11-15 LAB — TROPONIN I

## 2015-11-15 MED ORDER — THIAMINE HCL 100 MG/ML IJ SOLN: 100.0000 mg | Freq: Every day | INTRAMUSCULAR | Status: DC

## 2015-11-15 MED ORDER — ADULT MULTIVITAMIN W/MINERALS CH
1.0000 | ORAL_TABLET | Freq: Every day | ORAL | Status: DC
  Administered 2015-11-16: 1 via ORAL
  Filled 2015-11-15: qty 1

## 2015-11-15 MED ORDER — FOLIC ACID 1 MG PO TABS
1.0000 mg | ORAL_TABLET | Freq: Every day | ORAL | Status: DC
  Administered 2015-11-16: 1 mg via ORAL
  Filled 2015-11-15: qty 1

## 2015-11-15 MED ORDER — BISACODYL 10 MG RE SUPP: 10.0000 mg | Freq: Every day | RECTAL | Status: DC | PRN

## 2015-11-15 MED ORDER — ONDANSETRON HCL 4 MG/2ML IJ SOLN: 4.0000 mg | Freq: Four times a day (QID) | INTRAMUSCULAR | Status: DC | PRN

## 2015-11-15 MED ORDER — FLEET ENEMA 7-19 GM/118ML RE ENEM: 1.0000 | ENEMA | Freq: Once | RECTAL | Status: DC

## 2015-11-15 MED ORDER — VITAMIN B-1 100 MG PO TABS
100.0000 mg | ORAL_TABLET | Freq: Every day | ORAL | Status: DC
  Administered 2015-11-16: 100 mg via ORAL
  Filled 2015-11-15: qty 1

## 2015-11-15 MED ORDER — DOCUSATE SODIUM 100 MG PO CAPS
100.0000 mg | ORAL_CAPSULE | Freq: Two times a day (BID) | ORAL | Status: DC
  Administered 2015-11-15 – 2015-11-16 (×2): 100 mg via ORAL
  Filled 2015-11-15 (×2): qty 1

## 2015-11-15 MED ORDER — ACETAMINOPHEN 650 MG RE SUPP: 650.0000 mg | Freq: Four times a day (QID) | RECTAL | Status: DC | PRN

## 2015-11-15 MED ORDER — SODIUM CHLORIDE 0.9% FLUSH: 3.0000 mL | INTRAVENOUS | Status: DC | PRN

## 2015-11-15 MED ORDER — ONDANSETRON HCL 4 MG PO TABS: 4.0000 mg | ORAL_TABLET | Freq: Four times a day (QID) | ORAL | Status: DC | PRN

## 2015-11-15 MED ORDER — OXYCODONE HCL 5 MG PO TABS: 5.0000 mg | ORAL_TABLET | ORAL | Status: DC | PRN

## 2015-11-15 MED ORDER — SODIUM CHLORIDE 0.9 % IV SOLN
10.0000 mL/h | Freq: Once | INTRAVENOUS | Status: AC
  Administered 2015-11-15: 10 mL/h via INTRAVENOUS

## 2015-11-15 MED ORDER — ACETAMINOPHEN 325 MG PO TABS
650.0000 mg | ORAL_TABLET | Freq: Four times a day (QID) | ORAL | Status: DC | PRN
  Administered 2015-11-16: 650 mg via ORAL
  Filled 2015-11-15: qty 2

## 2015-11-15 MED ORDER — PANTOPRAZOLE SODIUM 40 MG PO TBEC
40.0000 mg | DELAYED_RELEASE_TABLET | Freq: Every day | ORAL | Status: DC
  Administered 2015-11-16: 40 mg via ORAL
  Filled 2015-11-15: qty 1

## 2015-11-15 MED ORDER — LORAZEPAM 2 MG/ML IJ SOLN: 1.0000 mg | Freq: Four times a day (QID) | INTRAMUSCULAR | Status: DC | PRN

## 2015-11-15 MED ORDER — LORAZEPAM 1 MG PO TABS
1.0000 mg | ORAL_TABLET | Freq: Four times a day (QID) | ORAL | Status: DC | PRN
  Administered 2015-11-16: 1 mg via ORAL
  Filled 2015-11-15: qty 1

## 2015-11-15 MED ORDER — SODIUM CHLORIDE 0.9 % IV SOLN: 250.0000 mL | INTRAVENOUS | Status: DC | PRN

## 2015-11-15 MED ORDER — SENNOSIDES-DOCUSATE SODIUM 8.6-50 MG PO TABS: 1.0000 | ORAL_TABLET | Freq: Every evening | ORAL | Status: DC | PRN

## 2015-11-15 MED ORDER — SODIUM CHLORIDE 0.9% FLUSH
3.0000 mL | Freq: Two times a day (BID) | INTRAVENOUS | Status: DC
  Administered 2015-11-15 – 2015-11-16 (×2): 3 mL via INTRAVENOUS

## 2015-11-15 NOTE — ED Notes (Signed)
Pt complains of vomiting for 3 days. Pt states she hasn't had a bowel movement in 3 days and has noticed a clear discharge from rectum. Pt also complains of knots coming up all over body and bilateral lower extremity swelling.

## 2015-11-15 NOTE — ED Provider Notes (Addendum)
[facility] Emergency Department Provider Note   ____________________________________________  Time seen: Approximately 7:09 PM  I have reviewed the triage vital signs and the nursing notes.   HISTORY  Chief Complaint Emesis and Constipation    HPI Lydia Hancock is a 40 y.o. female with history of anemia, alcoholism who presents for evaluation of abdominal pain in the past 3 days which she relates to constipation, gradual onset, constant, moderate, no modifying factors. She continues to pass gas from her bottom. She has had some vomiting yesterday but denies any frank hematemesis. She also endorses intermittent chest pains under the left breast as well as exertional shortness of breath over the past few weeks. No fevers or chills. No rash. No dysuria or hematuria. She denies heavy menstrual periods. She is not currently menstruating.   Past Medical History  Diagnosis Date  . Anemia   . Arthritis     There are no active problems to display for this patient.   Past Surgical History  Procedure Laterality Date  . Knee surgery      No current outpatient prescriptions on file.  Allergies Penicillins  No family history on file.  Social History Social History  Substance Use Topics  . Smoking status: Current Every Day Smoker -- 1.00 packs/day    Types: Cigarettes  . Smokeless tobacco: None  . Alcohol Use: Yes     Comment: Daily-bottle of jack daniels     Review of Systems Constitutional: No fever/chills Eyes: No visual changes. ENT: No sore throat. Cardiovascular: +intermittent chest pain. Respiratory: +exertional shortness of breath. Gastrointestinal: No abdominal pain.  No nausea, no vomiting.  No diarrhea.  No constipation. Genitourinary: Negative for dysuria. Musculoskeletal: Negative for back pain. Skin: Negative for rash. Neurological: Negative for headaches, focal weakness or numbness.  10-point ROS otherwise  negative.  ____________________________________________   PHYSICAL EXAM:  VITAL SIGNS: ED Triage Vitals  Enc Vitals Group     BP 11/15/15 1653 125/65 mmHg     Pulse Rate 11/15/15 1653 80     Resp 11/15/15 1653 20     Temp 11/15/15 1653 98.3 F (36.8 C)     Temp Source 11/15/15 1653 Oral     SpO2 11/15/15 1653 98 %     Weight 11/15/15 1653 215 lb (97.523 kg)     Height 11/15/15 1653 5\' 2"  (1.575 m)     Head Cir --      Peak Flow --      Pain Score 11/15/15 1654 7     Pain Loc --      Pain Edu? --      Excl. in [place]? --     Constitutional: Alert and oriented. Well appearing and in no acute distress. Eyes: Conjunctivae are normal. PERRL. EOMI. Head: Atraumatic. Nose: No congestion/rhinnorhea. Mouth/Throat: Mucous membranes are moist.  Oropharynx non-erythematous. Neck: No stridor. Supple without meningismus.  Cardiovascular: Normal rate, regular rhythm. Grossly normal heart sounds.  Good peripheral circulation. Respiratory: Normal respiratory effort.  No retractions. Lungs CTAB. Gastrointestinal: Soft and nontender. No distention. No CVA tenderness. Genitourinary: deferred Rectal: Brown stool in the rectal vault is guaiac negative. Musculoskeletal: No lower extremity tenderness nor edema.  No joint effusions. Neurologic:  Normal speech and language. No gross focal neurologic deficits are appreciated. No gait instability. Skin:  Skin is warm, dry and intact. No rash noted. Psychiatric: Mood and affect are normal. Speech and behavior are normal.  ____________________________________________   LABS (all labs ordered are  listed, but only abnormal results are displayed)  Labs Reviewed  COMPREHENSIVE METABOLIC PANEL - Abnormal; Notable for the following:    Glucose, Bld 109 (*)    Calcium 8.6 (*)    ALT 13 (*)    All other components within normal limits  CBC - Abnormal; Notable for the following:    Hemoglobin 6.7 (*)    HCT 23.7 (*)    MCV 61.2 (*)    MCH 17.4 (*)     MCHC 28.4 (*)    RDW 18.8 (*)    All other components within normal limits  URINALYSIS COMPLETEWITH MICROSCOPIC ([facility] ONLY) - Abnormal; Notable for the following:    Color, Urine YELLOW (*)    APPearance CLEAR (*)    Squamous Epithelial / LPF 0-5 (*)    All other components within normal limits  URINALYSIS COMPLETEWITH MICROSCOPIC ([facility] ONLY) - Abnormal; Notable for the following:    Color, Urine YELLOW (*)    APPearance CLEAR (*)    Squamous Epithelial / LPF 0-5 (*)    All other components within normal limits  LIPASE, BLOOD  TROPONIN I  POC URINE PREG, ED  POCT PREGNANCY, URINE  POC URINE PREG, ED  TYPE AND SCREEN  PREPARE RBC (CROSSMATCH)  ABO/RH   ____________________________________________  EKG  ED ECG REPORT I, Joanne Gavel, the attending physician, personally viewed and interpreted this ECG.   Date: 11/15/2015  EKG Time: 20:21  Rate: 76  Rhythm: normal EKG, normal sinus rhythm  Axis: normal  Intervals:none  ST&T Change: No acute ST elevation or acute ST depression.  ____________________________________________  RADIOLOGY  3 view abdominal plain films IMPRESSION: Negative abdominal radiographs. No active cardiopulmonary disease. ____________________________________________   PROCEDURES  Procedure(s) performed: None  Procedures  Critical Care performed: Yes, see critical care note(s)   CRITICAL CARE Performed by: Loura Pardon A   Total critical care time: 30 minutes  Critical care time was exclusive of separately billable procedures and treating other patients.  Critical care was necessary to treat or prevent imminent or life-threatening deterioration.  Critical care was time spent personally by me on the following activities: development of treatment plan with patient and/or surrogate as well as nursing, discussions with consultants, evaluation of patient's response to treatment, examination of patient, obtaining history from patient or  surrogate, ordering and performing treatments and interventions, ordering and review of laboratory studies, ordering and review of radiographic studies, pulse oximetry and re-evaluation of patient's condition.  ____________________________________________   INITIAL IMPRESSION / ASSESSMENT AND PLAN / ED COURSE  Pertinent labs & imaging results that were available during my care of the patient were reviewed by me and considered in my medical decision making (see chart for details).  AUDRIENA PIZZIMENTI is a 40 y.o. female with history of anemia, alcoholism who presents for evaluation of abdominal pain over the past 3 days which she relates to constipation. She also is endorsing intermittent chest pain as well as exertional dyspnea over the past few weeks, no chest pain currently. EKG is reassuring, not consistent with acute ischemia. Her vital signs are stable, she is afebrile. Her abdomen is soft, no tenderness, no rebound or guarding. Plain films negative for obstruction or perforation. CMP generally unremarkable. CBC shows severe anemia, hemoglobin 6.7 and given her complaints of chest pain and exertional dyspnea, concern for symptomatic anemia. We discussed risk and benefits and she wants to proceed with blood transfusion, we will give 2 units of packed red blood cells. Troponin is  negative. Pregnancy test also negative. At this time, the exact cause of her bleeding is not clear to me. She denies any history of heavy menstrual periods but then reports that she can "finish an 18 pack of super pads in one perios". The last day of her last period was July 4th and she is not currently menstruating. Additionally, she does drink up to 10 alcoholic beverages per day and takes [facility] powders daily so this could represent a slow upper GI bleed however she has no evidence of active bleeding on exam and is hemodynamically stable. I discussed the case with the hospitalist, Dr. Jannifer Franklin, for admission 8:30 PM. I also  discussed the case with Dr. Georgianne Fick of GYN and he is happy to consult, he recommends a transvaginal ultrasound to evaluate the endometrial stripe and he is happy to consult. ____________________________________________   FINAL CLINICAL IMPRESSION(S) / ED DIAGNOSES  Final diagnoses:  Symptomatic anemia  Constipation, unspecified constipation type  Anemia      NEW MEDICATIONS STARTED DURING THIS VISIT:  New Prescriptions   No medications on file     Note:  This document was prepared using Dragon voice recognition software and may include unintentional dictation errors.    Joanne Gavel, MD 11/15/15 2035  Joanne Gavel, MD 11/15/15 2036

## 2015-11-15 NOTE — H&P (Signed)
[facility] NAME: Lydia Hancock    MR#:  [id number]  DATE OF BIRTH:  10-16-1975  DATE OF ADMISSION:  11/15/2015  PRIMARY CARE PHYSICIAN: No PCP Per Patient   REQUESTING/REFERRING PHYSICIAN: Dr Edd Fabian  CHIEF COMPLAINT:   Constipation and epigastric abdominal pain HISTORY OF PRESENT ILLNESS:  Lydia Hancock  is a 40 y.o. female with a known history of Chronic anemia not on any iron supplementation or follow-up with primary care physician who comes in with complaints of constipation for last 3 days and some epigastric pain. She had vomiting today. Patient said she had vomiting yesterday as well and she talked she saw some red mucousy minimal amount of blood. She denies any blood per rectum. Her Hemoccult in the emergency room was negative. Patient reports to the ER physician she has been having had heavy menstrual cycles lately and has not been able to see OB/GYN. She does not take any iron supplements which she is supposed to be on. She has history of alcoholism drinks a bottle of whiskey in 3 days. She denies any liver problems due to her chronic alcoholism. She denies any history of GI bleeding or any further workup in the in the past. Patient was found to have hemoglobin of 6.7 she started getting 1 unit of blood transfusion. She is being admitted for further evaluation and management. PAST MEDICAL HISTORY:   Past Medical History  Diagnosis Date  . Anemia   . Arthritis     PAST SURGICAL HISTOIRY:   Past Surgical History  Procedure Laterality Date  . Knee surgery      SOCIAL HISTORY:   Social History  Substance Use Topics  . Smoking status: Current Every Day Smoker -- 1.00 packs/day    Types: Cigarettes  . Smokeless tobacco: Not on file  . Alcohol Use: Yes     Comment: Daily-bottle of jack daniels     FAMILY HISTORY:  No family history on file.  DRUG ALLERGIES:   Allergies  Allergen Reactions  .  Penicillins Anaphylaxis    Has patient had a PCN reaction causing immediate rash, facial/tongue/throat swelling, SOB or lightheadedness with hypotension: Yes Has patient had a PCN reaction causing severe rash involving mucus membranes or skin necrosis: No Has patient had a PCN reaction that required hospitalization No Has patient had a PCN reaction occurring within the last 10 years: No If all of the above answers are "NO", then may proceed with Cephalosporin use.     REVIEW OF SYSTEMS:  Review of Systems  Constitutional: Negative for fever, chills and weight loss.  HENT: Negative for ear discharge, ear pain and nosebleeds.   Eyes: Negative for blurred vision, pain and discharge.  Respiratory: Negative for sputum production, shortness of breath, wheezing and stridor.   Cardiovascular: Negative for chest pain, palpitations, orthopnea and PND.  Gastrointestinal: Positive for nausea, abdominal pain and constipation. Negative for vomiting and diarrhea.  Genitourinary: Negative for urgency and frequency.  Musculoskeletal: Negative for back pain and joint pain.  Neurological: Positive for weakness. Negative for sensory change, speech change and focal weakness.  Psychiatric/Behavioral: Negative for depression and hallucinations. The patient is not nervous/anxious.   All other systems reviewed and are negative.    MEDICATIONS AT HOME:   Prior to Admission medications   Not on File      VITAL SIGNS:  Blood pressure 165/83, pulse 77, temperature 97.9 F (36.6 C), temperature source Oral, resp. rate 20,  height 5\' 2"  (1.575 m), weight 97.523 kg (215 lb), last menstrual period 11/06/2015, SpO2 100 %.  PHYSICAL EXAMINATION:  GENERAL:  40 y.o.-year-old patient lying in the bed with no acute distress. Morbidly obese EYES: Pupils equal, round, reactive to light and accommodation. No scleral icterus. Extraocular muscles intact.  HEENT: Head atraumatic, normocephalic. Oropharynx and nasopharynx  clear.  NECK:  Supple, no jugular venous distention. No thyroid enlargement, no tenderness.  LUNGS: Normal breath sounds bilaterally, no wheezing, rales,rhonchi or crepitation. No use of accessory muscles of respiration.  CARDIOVASCULAR: S1, S2 normal. No murmurs, rubs, or gallops.  ABDOMEN: Soft, nontender, nondistended. Bowel sounds present. No organomegaly or mass.  EXTREMITIES: No pedal edema, cyanosis, or clubbing.  NEUROLOGIC: Cranial nerves II through XII are intact. Muscle strength 5/5 in all extremities. Sensation intact. Gait not checked.  PSYCHIATRIC: The patient is alert and oriented x 3.  SKIN: No obvious rash, lesion, or ulcer.   LABORATORY PANEL:   CBC  Recent Labs Lab 11/15/15 1715  WBC 3.9  HGB 6.7*  HCT 23.7*  PLT 311   ------------------------------------------------------------------------------------------------------------------  Chemistries   Recent Labs Lab 11/15/15 1715  NA 136  K 3.8  CL 103  CO2 25  GLUCOSE 109*  BUN 13  CREATININE 0.63  CALCIUM 8.6*  AST 19  ALT 13*  ALKPHOS 66  BILITOT 0.6   ------------------------------------------------------------------------------------------------------------------  Cardiac Enzymes  Recent Labs Lab 11/15/15 1715  TROPONINI <0.03   ------------------------------------------------------------------------------------------------------------------  RADIOLOGY:  Dg Abd Acute W/chest  11/15/2015  CLINICAL DATA:  Abdominal pain and vomiting for 3 days. Constipation. Lower extremity swelling. EXAM: DG ABDOMEN ACUTE W/ 1V CHEST COMPARISON:  Chest radiograph on 06/29/2011 FINDINGS: There is no evidence of dilated bowel loops or free intraperitoneal air. No radiopaque calculi or other significant radiographic abnormality is seen. Heart size and mediastinal contours are within normal limits. Both lungs are clear. IMPRESSION: Negative abdominal radiographs.  No active cardiopulmonary disease. Electronically  Signed   By: Earle Gell M.D.   On: 11/15/2015 20:06    EKG:    IMPRESSION AND PLAN:   Lydia Hancock  is a 40 y.o. female with a known history of Chronic anemia not on any iron supplementation or follow-up with primary care physician who comes in with complaints of constipation for last 3 days and some epigastric pain. She had vomiting today. Patient said she had vomiting yesterday as well and she talked she saw some red mucousy animal amount of blood. She denies any blood per rectum. Her Hemoccult in the emergency room was negative.  1. Acute on chronic anemia appears on deficiency anemia with low MCV of 61 -Patient has been anemic for a long time has been noncompliant with follow-up and an iron pills -She also has history of heavy menstrual cycles. No workup has been done in the past -Admitted to medical floor -1 unit of blood transfusion. Follow-up H&H. -Ultrasound pelvis has been ordered by ER physician. Results pending. -GYN consultation -Iron studies -Start patient on iron pills prior to discharge -Consider hematology consultation and outpatient follow-up for iron deficiency anemia  2. History of chronic alcoholism -Place patient on [place] protocol -No history of liver problems, patient's LFTs look normal -She is advised on alcohol cessation -Hemoccult negative  3. Epigastric pain suspected GERD/acute gastritis -Patient advised to avoid NSAIDs and BC powders -By mouth Protonix -She will need GI workup as outpatient -No GI coverage. Patient is not actively bleeding  4. DVT prophylaxis SCDs and teds  No  family members present, was discussed with patient was agreeable to it.  All the records are reviewed and case discussed with ED provider. Management plans discussed with the patient, family and they are in agreement.  CODE STATUS: full  TOTAL TIME TAKING CARE OF THIS PATIENT: 50 minutes.    Lydia Hancock M.D on 11/15/2015 at 9:14 PM  Between 7am to 6pm - Pager -  [phone]  After 6pm go to [email or web address] - password [email or web address] [facility]  [facility]  Office  [phone]  CC: Primary care physician; No PCP Per Patient

## 2015-11-15 NOTE — ED Notes (Signed)
Pt pale, dark circles under eyes, cruising easily. Endorses using alcohol daily and using goody powders and aspirin. Denies rectal bleeding. Denies heavy periods.

## 2015-11-16 ENCOUNTER — Inpatient Hospital Stay: Payer: Self-pay

## 2015-11-16 LAB — TYPE AND SCREEN
ABO/RH(D): A POS
ANTIBODY SCREEN: NEGATIVE
UNIT DIVISION: 0
UNIT DIVISION: 0

## 2015-11-16 LAB — CBC
HCT: 29.1 % — ABNORMAL LOW (ref 35.0–47.0)
Hemoglobin: 8.8 g/dL — ABNORMAL LOW (ref 12.0–16.0)
MCH: 19.8 pg — AB (ref 26.0–34.0)
MCHC: 30.3 g/dL — ABNORMAL LOW (ref 32.0–36.0)
MCV: 65.3 fL — AB (ref 80.0–100.0)
PLATELETS: 323 10*3/uL (ref 150–440)
RBC: 4.46 MIL/uL (ref 3.80–5.20)
RDW: 22.9 % — AB (ref 11.5–14.5)
WBC: 5.6 10*3/uL (ref 3.6–11.0)

## 2015-11-16 MED ORDER — SODIUM CHLORIDE 0.9 % IV SOLN
200.0000 mg | Freq: Once | INTRAVENOUS | Status: AC
  Administered 2015-11-16: 200 mg via INTRAVENOUS
  Filled 2015-11-16: qty 10

## 2015-11-16 MED ORDER — TRAMADOL HCL 50 MG PO TABS
50.0000 mg | ORAL_TABLET | Freq: Four times a day (QID) | ORAL | Status: DC | PRN
Start: 2015-11-16 — End: 2017-10-26

## 2015-11-16 MED ORDER — TRAMADOL HCL 50 MG PO TABS: 50.0000 mg | ORAL_TABLET | Freq: Four times a day (QID) | ORAL | Status: DC | PRN

## 2015-11-16 MED ORDER — FERROUS SULFATE 325 (65 FE) MG PO TABS: 325.0000 mg | ORAL_TABLET | Freq: Two times a day (BID) | ORAL | Status: DC

## 2015-11-16 MED ORDER — BACITRACIN-NEOMYCIN-POLYMYXIN OINTMENT TUBE
TOPICAL_OINTMENT | Freq: Three times a day (TID) | CUTANEOUS | Status: DC
Start: 2015-11-16 — End: 2015-11-16
  Administered 2015-11-16: 17:00:00 via TOPICAL
  Filled 2015-11-16: qty 15

## 2015-11-16 MED ORDER — FERROUS SULFATE 325 (65 FE) MG PO TABS
325.0000 mg | ORAL_TABLET | Freq: Two times a day (BID) | ORAL | Status: DC
  Administered 2015-11-16 (×2): 325 mg via ORAL
  Filled 2015-11-16 (×2): qty 1

## 2015-11-16 MED ORDER — BACIT-POLY-NEO HC 1 % EX OINT
TOPICAL_OINTMENT | Freq: Three times a day (TID) | CUTANEOUS | Status: DC
  Filled 2015-11-16: qty 15

## 2015-11-16 MED ORDER — HYDROCORTISONE 1 % EX OINT
TOPICAL_OINTMENT | Freq: Three times a day (TID) | CUTANEOUS | Status: DC
  Administered 2015-11-16: 16:00:00 via TOPICAL
  Filled 2015-11-16: qty 28.35

## 2015-11-16 MED ORDER — OMEPRAZOLE 20 MG PO CPDR: 20.0000 mg | DELAYED_RELEASE_CAPSULE | Freq: Every day | ORAL | Status: DC

## 2015-11-16 NOTE — Progress Notes (Signed)
Pt received two units of blood tonight,

## 2015-11-16 NOTE — Progress Notes (Signed)
Patient discharged to home this afternoon. Discharge and Rx instructions given. IV removed. Family here to take her home.

## 2015-11-16 NOTE — Consult Note (Signed)
GYNECOLOGY CONSULT NOTE  GYN Consultation  Attending Provider: Gladstone Lighter, MD   LILAS SPAR KA:[id number] 11/16/2015 5:10 PM    Reason for Consultation:   Lydia Hancock is a 40 y.o. G2P0201 female seen for evaluation of heavy vaginal bleeding in the setting of a hospital admission for severe anemia necessitating blood transfusion.    History of Present Ilness:   The patient has regular menses occuring once every 28 days, lasting for three days.  They are quite heavy, requiring 6 super tampons per day and she passes clots.  She does not use any hormonal medication.  She has noted no change in her menstrual cycle recently. She notes that her problem with alcohol consumption has become worse and this may have somehow contributed to her anemia.  Her LMP was 11/04/15 and was normal for her.  She did not notice any difference in her symptoms prior to and after her menses.  She had a pelvic ultrasound during this admission that showed a 2.5cm anterior fibroid, the exact location of this fibroid (subserosal, intramural, or submucosal) were not specified and I could not discern in my review of her images.  Her last pap smear was several years ago and was abnormal.  She states that some procedure was performed on her cervix, but she has not followed up since that time.  She denies a history of STDs.   Past Medical History  Diagnosis Date  . Anemia   . Arthritis    Past Surgical History  Procedure Laterality Date  . Knee surgery     Allergies  Allergen Reactions  . Penicillins Anaphylaxis    Has patient had a PCN reaction causing immediate rash, facial/tongue/throat swelling, SOB or lightheadedness with hypotension: Yes Has patient had a PCN reaction causing severe rash involving mucus membranes or skin necrosis: No Has patient had a PCN reaction that required hospitalization No Has patient had a PCN reaction occurring within the last 10 years: No If all of the above answers are "NO",  then may proceed with Cephalosporin use.    Prior to Admission medications   Medication Sig Start Date End Date Taking? Authorizing Provider  ferrous sulfate 325 (65 FE) MG tablet Take 1 tablet (325 mg total) by mouth 2 (two) times daily with a meal. 11/16/15   Gladstone Lighter, MD  omeprazole (PRILOSEC) 20 MG capsule Take 1 capsule (20 mg total) by mouth daily. 11/16/15   Gladstone Lighter, MD  traMADol (ULTRAM) 50 MG tablet Take 1 tablet (50 mg total) by mouth every 6 (six) hours as needed for moderate pain or severe pain. 11/16/15   Gladstone Lighter, MD   Obstetric History: She is a G69P0201 female, status post SVD x2 .  Her first child was born at 53 months and passed away after three days. Her second child was born 61 months early and is doing well.   Gynecologic History:   Menarche age: 15 Patient's last menstrual period was 11/06/2015 (exact date). Cycle Hx: flow is excessive with use of 6 pads or tampons on heaviest days Last Pap: Several years ago She reports an abnormal pap smear, as per the HPI  She denies history of STDs. Contraception: none   Social History:  She  reports that she has been smoking Cigarettes.  She has been smoking about 1.00 pack per day. She does not have any smokeless tobacco history on file. She reports that she drinks alcohol. She reports that she does not use illicit drugs.  Family History:  The patient reports a complicated family history of diabetes, hypertension, anemia, cancer in multiple family members on both sides of the family.  Her mother and grandmother both had anemia. She does not know the cause of the anemia.  No one in her family is reported to have a bleeding problem.   Review of Systems:  Negative x 10 systems reviewed except as noted in the HPI.    Objective    BP 146/96 mmHg  Pulse 81  Temp(Src) 98 F (36.7 C) (Oral)  Resp 18  Ht 5\' 2"  (1.575 m)  Wt 245 lb 14.4 oz (111.54 kg)  BMI 44.96 kg/m2  SpO2 97%  LMP 11/06/2015 (Exact  Date) Physical Exam  General:  She is a well appearing female in no distress.  HEENT:  Normocephalic, atraumatic.   Neck:  supple, no lymphadenopathy Cardiac:  RRR Pulmonary:  Clear to auscultation bilaterally. No wheezes, rales, rhonchi.   Abdomen:  Obese, soft, nontender, non-distedned. No rebound or guarding.  Pelvic:  deferred Extremities:  Non-tender, symmetric no edema bilaterally.   Neurologic:  Alert & oriented x 3.  Appropriate, conversant.    Laboratory Results:   Lab Results  Component Value Date   WBC 5.6 11/16/2015   RBC 4.46 11/16/2015   HGB 8.8* 11/16/2015   HCT 29.1* 11/16/2015   PLT 323 11/16/2015   NA 136 11/15/2015   K 3.8 11/15/2015   CREATININE 0.63 11/15/2015   Lab Results  Component Value Date   PREGTESTUR NEGATIVE 11/15/2015    Imaging Results:  US Transvaginal Non-ob  11/16/2015  CLINICAL DATA:  Anemia EXAM: TRANSABDOMINAL AND TRANSVAGINAL ULTRASOUND OF PELVIS TECHNIQUE: Both transabdominal and transvaginal ultrasound examinations of the pelvis were performed. Transabdominal technique was performed for global imaging of the pelvis including uterus, ovaries, adnexal regions, and pelvic cul-de-sac. It was necessary to proceed with endovaginal exam following the transabdominal exam to visualize the ovaries. COMPARISON:  None FINDINGS: Uterus Measurements: 9.8 x 5.3 x 5.9 cm. Anterior uterine fibroid is noted measuring 2.6 cm in greatest dimension. Endometrium Thickness: 4.8 mm.  No focal abnormality visualized. Right ovary Not visualized Left ovary Measurements: 2.5 x 1.9 x 2.0 cm. Multiple small follicles are seen. Other findings No abnormal free fluid. IMPRESSION: Anterior uterine fibroid.  No other focal abnormality is seen. Electronically Signed   By: Inez Catalina M.D.   On: 11/16/2015 09:58   US Pelvis Complete  11/16/2015  CLINICAL DATA:  Anemia EXAM: TRANSABDOMINAL AND TRANSVAGINAL ULTRASOUND OF PELVIS TECHNIQUE: Both transabdominal and transvaginal  ultrasound examinations of the pelvis were performed. Transabdominal technique was performed for global imaging of the pelvis including uterus, ovaries, adnexal regions, and pelvic cul-de-sac. It was necessary to proceed with endovaginal exam following the transabdominal exam to visualize the ovaries. COMPARISON:  None FINDINGS: Uterus Measurements: 9.8 x 5.3 x 5.9 cm. Anterior uterine fibroid is noted measuring 2.6 cm in greatest dimension. Endometrium Thickness: 4.8 mm.  No focal abnormality visualized. Right ovary Not visualized Left ovary Measurements: 2.5 x 1.9 x 2.0 cm. Multiple small follicles are seen. Other findings No abnormal free fluid. IMPRESSION: Anterior uterine fibroid.  No other focal abnormality is seen. Electronically Signed   By: Inez Catalina M.D.   On: 11/16/2015 09:58   Dg Abd Acute W/chest  11/15/2015  CLINICAL DATA:  Abdominal pain and vomiting for 3 days. Constipation. Lower extremity swelling. EXAM: DG ABDOMEN ACUTE W/ 1V CHEST COMPARISON:  Chest radiograph on 06/29/2011 FINDINGS: There is no evidence  of dilated bowel loops or free intraperitoneal air. No radiopaque calculi or other significant radiographic abnormality is seen. Heart size and mediastinal contours are within normal limits. Both lungs are clear. IMPRESSION: Negative abdominal radiographs.  No active cardiopulmonary disease. Electronically Signed   By: Earle Gell M.D.   On: 11/15/2015 20:06     Assessment & Recommendations   Lydia Hancock is a 40 y.o. G2P0201 premenopausal female with anemia and heavy menstrual periods being seen in consultation.    Recommendations:  1.  Oral iron intake 2.  Reduce or eliminated EtOH intake 3.  Patient may follow up to have a pap smear to get this up to date as she may be at increased risk based on the history she provided. 4.  Discussed a few strategies to reduce menstrual blood flow.  Patient may follow up as an outpatient to discuss further.  Thank you for this  consult.  Will Bonnet, MD 11/16/2015 5:10 PM

## 2015-11-16 NOTE — Discharge Summary (Signed)
[facility] [place] NAME: Lydia Hancock    MR#:  [id number]  DATE OF BIRTH:  1976-04-03  DATE OF ADMISSION:  11/15/2015 ADMITTING PHYSICIAN: Fritzi Mandes, MD  DATE OF DISCHARGE: 11/16/15  PRIMARY CARE PHYSICIAN: No PCP Per Patient    ADMISSION DIAGNOSIS:  Anemia [D64.9] Heavy menses [N92.0] Symptomatic anemia [D64.9] Constipation, unspecified constipation type [K59.00]  DISCHARGE DIAGNOSIS:  Active Problems:   Anemia   SECONDARY DIAGNOSIS:   Past Medical History  Diagnosis Date  . Anemia   . Arthritis     HOSPITAL COURSE:   40y/o F with known iron deficiency anemia came in constipation adn noted to have severe acute on chronic anemia.  #1 Acute on chronic anemia- iron deficiency anemia, GU losses likely - guaiac negative in ED, no GI losses per patient - IV iron once, oral iron BID - received 2 units PRBC Tx this admission and hb appropriately elevated - significantly low iron levels- outpatient hematology f/u for IV iron needs and monitoring  #2 Dysmenorrhea-ultrasound of the pelvis showing uterine fibroid -GYN consult pending, likely outpatient follow-up.  #3 history of chronic alcoholism-counseled, patient to quit. -LFTs within normal limits.  #4 GERD-advised to stop BC powders. -Tramadol when necessary for her joint pains -Being discharged on Prilosec  Discharge today   DISCHARGE CONDITIONS:   Stable  CONSULTS OBTAINED:   ObGyn consult by Dr. Glennon Mac  DRUG ALLERGIES:   Allergies  Allergen Reactions  . Penicillins Anaphylaxis    Has patient had a PCN reaction causing immediate rash, facial/tongue/throat swelling, SOB or lightheadedness with hypotension: Yes Has patient had a PCN reaction causing severe rash involving mucus membranes or skin necrosis: No Has patient had a PCN reaction that required hospitalization No Has patient had a PCN reaction occurring within the last 10 years: No If all of  the above answers are "NO", then may proceed with Cephalosporin use.     DISCHARGE MEDICATIONS:   Current Discharge Medication List    START taking these medications   Details  ferrous sulfate 325 (65 FE) MG tablet Take 1 tablet (325 mg total) by mouth 2 (two) times daily with a meal. Qty: 60 tablet, Refills: 3    omeprazole (PRILOSEC) 20 MG capsule Take 1 capsule (20 mg total) by mouth daily. Qty: 30 capsule, Refills: 2    traMADol (ULTRAM) 50 MG tablet Take 1 tablet (50 mg total) by mouth every 6 (six) hours as needed for moderate pain or severe pain. Qty: 20 tablet, Refills: 0         DISCHARGE INSTRUCTIONS:   1. PCP f/u in 1-2 weeks 2. Obgyn f/u in 2 weeks   If you experience worsening of your admission symptoms, develop shortness of breath, life threatening emergency, suicidal or homicidal thoughts you must seek medical attention immediately by calling 911 or calling your MD immediately  if symptoms less severe.  You Must read complete instructions/literature along with all the possible adverse reactions/side effects for all the Medicines you take and that have been prescribed to you. Take any new Medicines after you have completely understood and accept all the possible adverse reactions/side effects.   Please note  You were cared for by a hospitalist during your hospital stay. If you have any questions about your discharge medications or the care you received while you were in the hospital after you are discharged, you can call the unit and asked to speak with the hospitalist on call  if the hospitalist that took care of you is not available. Once you are discharged, your primary care physician will handle any further medical issues. Please note that NO REFILLS for any discharge medications will be authorized once you are discharged, as it is imperative that you return to your primary care physician (or establish a relationship with a primary care physician if you do not have  one) for your aftercare needs so that they can reassess your need for medications and monitor your lab values.    Today   CHIEF COMPLAINT:   Chief Complaint  Patient presents with  . Emesis  . Constipation     VITAL SIGNS:  Blood pressure 136/86, pulse 79, temperature 98.1 F (36.7 C), temperature source Oral, resp. rate 19, height 5\' 2"  (1.575 m), weight 111.54 kg (245 lb 14.4 oz), last menstrual period 11/06/2015, SpO2 99 %.  I/O:   Intake/Output Summary (Last 24 hours) at 11/16/15 1315 Last data filed at 11/16/15 0900  Gross per 24 hour  Intake 2289.13 ml  Output      1 ml  Net 2288.13 ml    PHYSICAL EXAMINATION:   Physical Exam  GENERAL:  40 y.o.-year-old obese patient lying in the bed with no acute distress.  EYES: Pupils equal, round, reactive to light and accommodation. No scleral icterus. Pale conjunctivae. Extraocular muscles intact.  HEENT: Head atraumatic, normocephalic. Oropharynx and nasopharynx clear.  NECK:  Supple, no jugular venous distention. No thyroid enlargement, no tenderness.  LUNGS: Normal breath sounds bilaterally, no wheezing, rales,rhonchi or crepitation. No use of accessory muscles of respiration.  CARDIOVASCULAR: S1, S2 normal. No murmurs, rubs, or gallops.  ABDOMEN: Soft, non-tender, non-distended. Bowel sounds present. No organomegaly or mass.  EXTREMITIES: No pedal edema, cyanosis, or clubbing.  NEUROLOGIC: Cranial nerves II through XII are intact. Muscle strength 5/5 in all extremities. Sensation intact. Gait not checked.  PSYCHIATRIC: The patient is alert and oriented x 3.  SKIN: several healed scabs from bug bites on extremities and abdomen noted.   DATA REVIEW:   CBC  Recent Labs Lab 11/16/15 0806  WBC 5.6  HGB 8.8*  HCT 29.1*  PLT 323    Chemistries   Recent Labs Lab 11/15/15 1715  NA 136  K 3.8  CL 103  CO2 25  GLUCOSE 109*  BUN 13  CREATININE 0.63  CALCIUM 8.6*  AST 19  ALT 13*  ALKPHOS 66  BILITOT 0.6     Cardiac Enzymes  Recent Labs Lab 11/15/15 1715  TROPONINI <0.03    Microbiology Results  Results for orders placed or performed during the hospital encounter of 04/14/08  Rapid strep screen     Status: None   Collection Time: 04/14/08  9:06 AM  Result Value Ref Range Status   Streptococcus, Group A Screen (Direct) NEGATIVE NEGATIVE Final    RADIOLOGY:  US Transvaginal Non-ob  11/16/2015  CLINICAL DATA:  Anemia EXAM: TRANSABDOMINAL AND TRANSVAGINAL ULTRASOUND OF PELVIS TECHNIQUE: Both transabdominal and transvaginal ultrasound examinations of the pelvis were performed. Transabdominal technique was performed for global imaging of the pelvis including uterus, ovaries, adnexal regions, and pelvic cul-de-sac. It was necessary to proceed with endovaginal exam following the transabdominal exam to visualize the ovaries. COMPARISON:  None FINDINGS: Uterus Measurements: 9.8 x 5.3 x 5.9 cm. Anterior uterine fibroid is noted measuring 2.6 cm in greatest dimension. Endometrium Thickness: 4.8 mm.  No focal abnormality visualized. Right ovary Not visualized Left ovary Measurements: 2.5 x 1.9 x 2.0 cm. Multiple small  follicles are seen. Other findings No abnormal free fluid. IMPRESSION: Anterior uterine fibroid.  No other focal abnormality is seen. Electronically Signed   By: Inez Catalina M.D.   On: 11/16/2015 09:58   US Pelvis Complete  11/16/2015  CLINICAL DATA:  Anemia EXAM: TRANSABDOMINAL AND TRANSVAGINAL ULTRASOUND OF PELVIS TECHNIQUE: Both transabdominal and transvaginal ultrasound examinations of the pelvis were performed. Transabdominal technique was performed for global imaging of the pelvis including uterus, ovaries, adnexal regions, and pelvic cul-de-sac. It was necessary to proceed with endovaginal exam following the transabdominal exam to visualize the ovaries. COMPARISON:  None FINDINGS: Uterus Measurements: 9.8 x 5.3 x 5.9 cm. Anterior uterine fibroid is noted measuring 2.6 cm in greatest  dimension. Endometrium Thickness: 4.8 mm.  No focal abnormality visualized. Right ovary Not visualized Left ovary Measurements: 2.5 x 1.9 x 2.0 cm. Multiple small follicles are seen. Other findings No abnormal free fluid. IMPRESSION: Anterior uterine fibroid.  No other focal abnormality is seen. Electronically Signed   By: Inez Catalina M.D.   On: 11/16/2015 09:58   Dg Abd Acute W/chest  11/15/2015  CLINICAL DATA:  Abdominal pain and vomiting for 3 days. Constipation. Lower extremity swelling. EXAM: DG ABDOMEN ACUTE W/ 1V CHEST COMPARISON:  Chest radiograph on 06/29/2011 FINDINGS: There is no evidence of dilated bowel loops or free intraperitoneal air. No radiopaque calculi or other significant radiographic abnormality is seen. Heart size and mediastinal contours are within normal limits. Both lungs are clear. IMPRESSION: Negative abdominal radiographs.  No active cardiopulmonary disease. Electronically Signed   By: Earle Gell M.D.   On: 11/15/2015 20:06    EKG:   Orders placed or performed during the hospital encounter of 11/15/15  . ED EKG  . ED EKG  . EKG 12-Lead  . EKG 12-Lead      Management plans discussed with the patient, family and they are in agreement.  CODE STATUS:     Code Status Orders        Start     Ordered   11/15/15 2232  Full code   Continuous     11/15/15 2231    Code Status History    Date Active Date Inactive Code Status Order ID Comments User Context   This patient has a current code status but no historical code status.      TOTAL TIME TAKING CARE OF THIS PATIENT: 37 minutes.    Gladstone Lighter M.D on 11/16/2015 at 1:15 PM  Between 7am to 6pm - Pager - [phone]  After 6pm go to [email or web address] - password [email or web address] [facility]  [facility]  Office  [phone]  CC: Primary care physician; No PCP Per Patient

## 2015-11-16 NOTE — Care Management Note (Signed)
Case Management Note  Patient Details  Name: Lydia Hancock MRN: [id number] Date of Birth: 1975-08-21  Subjective/Objective:  Spoke with patient for discharge planning. She stated that she is insured with Medicaid and that her income is greater that $1200 monthly. She gets her medications filled at [facility] with out difficulty. She drives self and ambulates without difficulty. PCP is [facility].     No CM Needs identified.         Action/Plan: Dishcarge today to home with self care. Signed off   Expected Discharge Date:  11/17/15               Expected Discharge Plan:  Home/Self Care  In-House Referral:     Discharge planning Services  CM Consult  Post Acute Care Choice:    Choice offered to:     DME Arranged:    DME Agency:     HH Arranged:    HH Agency:     Status of Service:  Completed, signed off  If discussed at [employer] of Stay Meetings, dates discussed:    Additional Comments:  Alvie Heidelberg, RN 11/16/2015, 12:24 PM

## 2016-12-11 ENCOUNTER — Encounter: Payer: Self-pay | Admitting: Emergency Medicine

## 2016-12-11 ENCOUNTER — Emergency Department
Admission: EM | Admit: 2016-12-11 | Discharge: 2016-12-11 | Disposition: A | Discharge status: Home / Self Care | Payer: Federal, State, Local not specified - PPO | Attending: Emergency Medicine | Admitting: Emergency Medicine

## 2016-12-11 DIAGNOSIS — F17210 Nicotine dependence, cigarettes, uncomplicated: Secondary | ICD-10-CM | POA: Insufficient documentation

## 2016-12-11 DIAGNOSIS — I10 Essential (primary) hypertension: Secondary | ICD-10-CM | POA: Insufficient documentation

## 2016-12-11 DIAGNOSIS — R079 Chest pain, unspecified: Secondary | ICD-10-CM | POA: Insufficient documentation

## 2016-12-11 DIAGNOSIS — Z79899 Other long term (current) drug therapy: Secondary | ICD-10-CM | POA: Insufficient documentation

## 2016-12-11 HISTORY — DX: Essential (primary) hypertension: I10

## 2016-12-11 LAB — COMPREHENSIVE METABOLIC PANEL
ALBUMIN: 3.6 g/dL (ref 3.5–5.0)
ALT: 25 U/L (ref 14–54)
AST: 27 U/L (ref 15–41)
Alkaline Phosphatase: 82 U/L (ref 38–126)
Anion gap: 10 (ref 5–15)
BUN: 11 mg/dL (ref 6–20)
CHLORIDE: 100 mmol/L — AB (ref 101–111)
CO2: 26 mmol/L (ref 22–32)
Calcium: 8.8 mg/dL — ABNORMAL LOW (ref 8.9–10.3)
Creatinine, Ser: 0.67 mg/dL (ref 0.44–1.00)
GFR calc Af Amer: >60 mL/min (ref >60)
GFR calc non Af Amer: >60 mL/min (ref >60)
GLUCOSE: 108 mg/dL — AB (ref 65–99)
POTASSIUM: 3.4 mmol/L — AB (ref 3.5–5.1)
Sodium: 136 mmol/L (ref 135–145)
Total Bilirubin: 0.4 mg/dL (ref 0.3–1.2)
Total Protein: 7.7 g/dL (ref 6.5–8.1)

## 2016-12-11 LAB — CBC
HEMATOCRIT: 34.4 % — AB (ref 35.0–47.0)
Hemoglobin: 11.4 g/dL — ABNORMAL LOW (ref 12.0–16.0)
MCH: 29.3 pg (ref 26.0–34.0)
MCHC: 33.1 g/dL (ref 32.0–36.0)
MCV: 88.4 fL (ref 80.0–100.0)
PLATELETS: 288 10*3/uL (ref 150–440)
RBC: 3.89 MIL/uL (ref 3.80–5.20)
RDW: 17.1 % — AB (ref 11.5–14.5)
WBC: 5.9 10*3/uL (ref 3.6–11.0)

## 2016-12-11 LAB — ETHANOL: Alcohol, Ethyl (B): <5 mg/dL (ref <5)

## 2016-12-11 LAB — TROPONIN I
Troponin I: <0.03 ng/mL (ref <0.03)
Troponin I: <0.03 ng/mL (ref <0.03)

## 2016-12-11 LAB — LIPASE, BLOOD: LIPASE: 21 U/L (ref 11–51)

## 2016-12-11 MED ORDER — POTASSIUM CHLORIDE 20 MEQ PO PACK
40.0000 meq | PACK | Freq: Once | ORAL | Status: AC
  Administered 2016-12-11: 40 meq via ORAL

## 2016-12-11 MED ORDER — POTASSIUM CHLORIDE 20 MEQ PO PACK
PACK | ORAL | Status: DC
Start: 2016-12-11 — End: 2016-12-11
  Filled 2016-12-11: qty 2

## 2016-12-11 MED ORDER — POTASSIUM CHLORIDE CRYS ER 20 MEQ PO TBCR: 40.0000 meq | EXTENDED_RELEASE_TABLET | Freq: Two times a day (BID) | ORAL | Status: DC

## 2016-12-11 NOTE — ED Notes (Signed)
Pt verbalizes understanding of discharge instructions.

## 2016-12-11 NOTE — ED Notes (Signed)
Pt ambulatory to restroom

## 2016-12-11 NOTE — ED Triage Notes (Signed)
Pt bib [facility] d/t c/o chest, back, neck, left shoulder pain starting 2 hours PTA. Pt states diaphoresis, nausea, SHOB associated. Pt denies extensive cardiac hx, reports HTN, anxiety, fluid retention, alcohol use. Pt VSS en route, received 324 aspirin PTA.

## 2016-12-11 NOTE — ED Notes (Signed)
Pt reports she has been "drinking since yesterday". States she drank whiskey,beer, and [place]. Pain and cramping in the legs, feet, back, and chest pains.

## 2016-12-11 NOTE — ED Notes (Signed)
Provided pt with warm blanket

## 2016-12-11 NOTE — ED Provider Notes (Signed)
[facility] Emergency Department Provider Note   First MD Initiated Contact with Patient 12/11/16 0025     (approximate)  I have reviewed the triage vital signs and the nursing notes.   HISTORY  Chief Complaint Chest Pain    HPI Lydia Hancock is a 41 y.o. female with history of anemia presents to the emergency department with acute onset of left upper chest mid back and neck discomfort which started approximately 2 hours ago. Patient states her current pain score 7 out of 10. Patient denies any dyspnea no diaphoresis no nausea or vomiting. Patient denies any palpitations or dizziness. Patient admits to alcohol ingestion with most recent 3 hours ago.   Past Medical History:  Diagnosis Date  . Anemia   . Arthritis   . Hypertension     Patient Active Problem List   Diagnosis Date Noted  . Anemia 11/15/2015    Past Surgical History:  Procedure Laterality Date  . KNEE SURGERY    . WISDOM TOOTH EXTRACTION      Prior to Admission medications   Medication Sig Start Date End Date Taking? Authorizing Provider  ferrous sulfate 325 (65 FE) MG tablet Take 1 tablet (325 mg total) by mouth 2 (two) times daily with a meal. 11/16/15   Gladstone Lighter, MD  omeprazole (PRILOSEC) 20 MG capsule Take 1 capsule (20 mg total) by mouth daily. 11/16/15   Gladstone Lighter, MD  traMADol (ULTRAM) 50 MG tablet Take 1 tablet (50 mg total) by mouth every 6 (six) hours as needed for moderate pain or severe pain. 11/16/15   Gladstone Lighter, MD    Allergies Penicillins  No family history on file.  Social History Social History  Substance Use Topics  . Smoking status: Current Every Day Smoker    Packs/day: 1.00    Types: Cigarettes  . Smokeless tobacco: Never Used  . Alcohol use Yes     Comment: Daily-bottle of jack daniels     Review of Systems Constitutional: No fever/chills Eyes: No visual changes. ENT: No sore throat. Cardiovascular: Denies chest  pain. Respiratory: Denies shortness of breath. Gastrointestinal: No abdominal pain.  No nausea, no vomiting.  No diarrhea.  No constipation. Genitourinary: Negative for dysuria. Musculoskeletal: Negative for neck pain.  Negative for back pain. Integumentary: Negative for rash. Neurological: Negative for headaches, focal weakness or numbness.   ____________________________________________   PHYSICAL EXAM:  VITAL SIGNS: ED Triage Vitals  Enc Vitals Group     BP 12/11/16 0034 (!) 148/83     Pulse Rate 12/11/16 0034 89     Resp 12/11/16 0034 (!) 21     Temp 12/11/16 0034 98.3 F (36.8 C)     Temp Source 12/11/16 0034 Oral     SpO2 12/11/16 0029 99 %     Weight 12/11/16 0035 123.8 kg (273 lb)     Height 12/11/16 0035 1.575 m (5\' 2" )     Head Circumference --      Peak Flow --      Pain Score 12/11/16 0034 7     Pain Loc --      Pain Edu? --      Excl. in [place]? --     Constitutional: Alert and oriented. Well appearing and in no acute distress. Eyes: Conjunctivae are normal.  Head: Atraumatic. Mouth/Throat: Mucous membranes are moist. Oropharynx non-erythematous. Neck: No stridor.   Cardiovascular: Normal rate, regular rhythm. Good peripheral circulation. Grossly normal heart sounds.Positive for left upper chest  wall tenderness to palpation. Respiratory: Normal respiratory effort.  No retractions. Lungs CTAB. Gastrointestinal: Soft and nontender. No distention.  Musculoskeletal: No lower extremity tenderness nor edema. No gross deformities of extremities. Neurologic:  Normal speech and language. No gross focal neurologic deficits are appreciated.  Skin:  Skin is warm, dry and intact. No rash noted. Psychiatric: Mood and affect are normal. Speech and behavior are normal.  ____________________________________________   LABS (all labs ordered are listed, but only abnormal results are displayed)  Labs Reviewed  CBC - Abnormal; Notable for the following:       Result Value    Hemoglobin 11.4 (*)    HCT 34.4 (*)    RDW 17.1 (*)    All other components within normal limits  COMPREHENSIVE METABOLIC PANEL - Abnormal; Notable for the following:    Potassium 3.4 (*)    Chloride 100 (*)    Glucose, Bld 108 (*)    Calcium 8.8 (*)    All other components within normal limits  TROPONIN I  ETHANOL  TROPONIN I  LIPASE, BLOOD   ____________________________________________  EKG  ED ECG REPORT I, [place] N Monti Villers, the attending physician, personally viewed and interpreted this ECG.   Date: 12/11/2016  EKG Time: 12:34 AM  Rate: 93  Rhythm: Normal sinus rhythm  Axis: Normal  Intervals: Normal  ST&T Change: None  ____________________________________________    Procedures   ____________________________________________   INITIAL IMPRESSION / ASSESSMENT AND PLAN / ED COURSE  Pertinent labs & imaging results that were available during my care of the patient were reviewed by me and considered in my medical decision making (see chart for details).  41 year old female presenting with reproducible chest pain. Laboratory data unremarkable including troponin 2. EKG revealed no evidence of ischemia or infarction.      ____________________________________________  FINAL CLINICAL IMPRESSION(S) / ED DIAGNOSES  Final diagnoses:  Chest pain, unspecified type     MEDICATIONS GIVEN DURING THIS VISIT:  Medications  potassium chloride (KLOR-CON) packet 40 mEq (40 mEq Oral Given 12/11/16 0521)     NEW OUTPATIENT MEDICATIONS STARTED DURING THIS VISIT:  Discharge Medication List as of 12/11/2016  5:01 AM      Discharge Medication List as of 12/11/2016  5:01 AM      Discharge Medication List as of 12/11/2016  5:01 AM       Note:  This document was prepared using Dragon voice recognition software and may include unintentional dictation errors.    Gregor Hams, MD 12/11/16 [phone]

## 2017-05-13 ENCOUNTER — Other Ambulatory Visit: Payer: Self-pay

## 2017-05-13 ENCOUNTER — Emergency Department: Payer: Self-pay

## 2017-05-13 ENCOUNTER — Emergency Department
Admission: EM | Admit: 2017-05-13 | Discharge: 2017-05-13 | Disposition: A | Discharge status: Home / Self Care | Payer: Self-pay | Attending: Emergency Medicine | Admitting: Emergency Medicine

## 2017-05-13 ENCOUNTER — Encounter: Payer: Self-pay | Admitting: Emergency Medicine

## 2017-05-13 DIAGNOSIS — I10 Essential (primary) hypertension: Secondary | ICD-10-CM | POA: Insufficient documentation

## 2017-05-13 DIAGNOSIS — F17210 Nicotine dependence, cigarettes, uncomplicated: Secondary | ICD-10-CM | POA: Insufficient documentation

## 2017-05-13 DIAGNOSIS — N939 Abnormal uterine and vaginal bleeding, unspecified: Secondary | ICD-10-CM | POA: Insufficient documentation

## 2017-05-13 LAB — POCT PREGNANCY, URINE: PREG TEST UR: NEGATIVE

## 2017-05-13 LAB — URINALYSIS, COMPLETE (UACMP) WITH MICROSCOPIC
BILIRUBIN URINE: NEGATIVE
GLUCOSE, UA: NEGATIVE mg/dL
KETONES UR: NEGATIVE mg/dL
LEUKOCYTES UA: NEGATIVE
Nitrite: NEGATIVE
PH: 6 (ref 5.0–8.0)
Protein, ur: 100 mg/dL — AB
SPECIFIC GRAVITY, URINE: 1.025 (ref 1.005–1.030)

## 2017-05-13 LAB — COMPREHENSIVE METABOLIC PANEL
ALK PHOS: 87 U/L (ref 38–126)
ALT: 18 U/L (ref 14–54)
AST: 29 U/L (ref 15–41)
Albumin: 3.3 g/dL — ABNORMAL LOW (ref 3.5–5.0)
Anion gap: 8 (ref 5–15)
BILIRUBIN TOTAL: 0.5 mg/dL (ref 0.3–1.2)
BUN: 9 mg/dL (ref 6–20)
CALCIUM: 8.5 mg/dL — AB (ref 8.9–10.3)
CO2: 29 mmol/L (ref 22–32)
Chloride: 99 mmol/L — ABNORMAL LOW (ref 101–111)
Creatinine, Ser: 0.66 mg/dL (ref 0.44–1.00)
Glucose, Bld: 145 mg/dL — ABNORMAL HIGH (ref 65–99)
Potassium: 3.6 mmol/L (ref 3.5–5.1)
Sodium: 136 mmol/L (ref 135–145)
Total Protein: 7.5 g/dL (ref 6.5–8.1)

## 2017-05-13 LAB — CBC
HCT: 29.2 % — ABNORMAL LOW (ref 35.0–47.0)
Hemoglobin: 9.1 g/dL — ABNORMAL LOW (ref 12.0–16.0)
MCH: 25.7 pg — ABNORMAL LOW (ref 26.0–34.0)
MCHC: 31.2 g/dL — ABNORMAL LOW (ref 32.0–36.0)
MCV: 82.3 fL (ref 80.0–100.0)
Platelets: 341 10*3/uL (ref 150–440)
RBC: 3.55 MIL/uL — AB (ref 3.80–5.20)
RDW: 15.8 % — AB (ref 11.5–14.5)
WBC: 4.6 10*3/uL (ref 3.6–11.0)

## 2017-05-13 LAB — LIPASE, BLOOD: Lipase: 23 U/L (ref 11–51)

## 2017-05-13 LAB — HCG, QUANTITATIVE, PREGNANCY: hCG, Beta Chain, Quant, S: <1 m[IU]/mL (ref <5)

## 2017-05-13 MED ORDER — MEDROXYPROGESTERONE ACETATE 10 MG PO TABS
10.0000 mg | ORAL_TABLET | Freq: Every day | ORAL | Status: DC
  Administered 2017-05-13: 10 mg via ORAL
  Filled 2017-05-13: qty 1

## 2017-05-13 MED ORDER — MEDROXYPROGESTERONE ACETATE 10 MG PO TABS: 10.0000 mg | ORAL_TABLET | Freq: Every day | ORAL | 0 refills | Status: DC

## 2017-05-13 NOTE — ED Triage Notes (Signed)
Pt to ed with c/o vaginal bleeding and passing clots x 18 days. Pt also reports hx of anemia.

## 2017-05-13 NOTE — ED Provider Notes (Signed)
[facility] Emergency Department Provider Note       Time seen: ----------------------------------------- 12:04 PM on 05/13/2017 -----------------------------------------   I have reviewed the triage vital signs and the nursing notes.  HISTORY   Chief Complaint Vaginal Bleeding    HPI Lydia Hancock is a 42 y.o. female with a history of anemia, arthritis and hypertension who presents to the ED for vaginal bleeding and passing clots for the last almost 3 weeks.  She does report a history of anemia.  She is having pain that is 7 out of 10 in the back.  She has had some abdominal cramping as well.  Past Medical History:  Diagnosis Date  . Anemia   . Arthritis   . Hypertension     Patient Active Problem List   Diagnosis Date Noted  . Anemia 11/15/2015    Past Surgical History:  Procedure Laterality Date  . KNEE SURGERY    . WISDOM TOOTH EXTRACTION      Allergies Penicillins  Social History Social History   Tobacco Use  . Smoking status: Current Every Day Smoker    Packs/day: 1.00    Types: Cigarettes  . Smokeless tobacco: Never Used  Substance Use Topics  . Alcohol use: Yes    Comment: Daily-bottle of jack daniels   . Drug use: No    Review of Systems Constitutional: Negative for fever. Eyes: Negative for vision changes ENT:  Negative for congestion, sore throat Cardiovascular: Negative for chest pain. Respiratory: Negative for shortness of breath. Gastrointestinal: Negative for abdominal pain, vomiting and diarrhea. Genitourinary: Negative for dysuria. positive for vaginal bleeding Musculoskeletal: Positive for back pain Skin: Negative for rash. Neurological: Negative for headaches, focal weakness or numbness.  All systems negative/normal/unremarkable except as stated in the HPI  ____________________________________________   PHYSICAL EXAM:  VITAL SIGNS: ED Triage Vitals [05/13/17 0829]  Enc Vitals Group     BP     Pulse      Resp 16     Temp      Temp Source Oral     SpO2      Weight 273 lb (123.8 kg)     Height      Head Circumference      Peak Flow      Pain Score 7     Pain Loc      Pain Edu?      Excl. in [place]?     Constitutional: Alert and oriented. Well appearing and in no distress. Eyes: Conjunctivae are normal. Normal extraocular movements. ENT   Head: Normocephalic and atraumatic.   Nose: No congestion/rhinnorhea.   Mouth/Throat: Mucous membranes are moist.   Neck: No stridor. Cardiovascular: Normal rate, regular rhythm. No murmurs, rubs, or gallops. Respiratory: Normal respiratory effort without tachypnea nor retractions. Breath sounds are clear and equal bilaterally. No wheezes/rales/rhonchi. Gastrointestinal: Soft and nontender. Normal bowel sounds Musculoskeletal: Nontender with normal range of motion in extremities. No lower extremity tenderness nor edema. Neurologic:  Normal speech and language. No gross focal neurologic deficits are appreciated.  Skin:  Skin is warm, dry and intact. No rash noted. Psychiatric: Mood and affect are normal. Speech and behavior are normal.  ____________________________________________  ED COURSE:  As part of my medical decision making, I reviewed the following data within the [place] History obtained from family if available, nursing notes, old chart and ekg, as well as notes from prior ED visits. Patient presented for vaginal bleeding, we will assess with  labs and imaging as indicated at this time.   Procedures ____________________________________________   LABS (pertinent positives/negatives)  Labs Reviewed  COMPREHENSIVE METABOLIC PANEL - Abnormal; Notable for the following components:      Result Value   Chloride 99 (*)    Glucose, Bld 145 (*)    Calcium 8.5 (*)    Albumin 3.3 (*)    All other components within normal limits  CBC - Abnormal; Notable for the following components:   RBC 3.55 (*)     Hemoglobin 9.1 (*)    HCT 29.2 (*)    MCH 25.7 (*)    MCHC 31.2 (*)    RDW 15.8 (*)    All other components within normal limits  URINALYSIS, COMPLETE (UACMP) WITH MICROSCOPIC - Abnormal; Notable for the following components:   Color, Urine RED (*)    APPearance HAZY (*)    Hgb urine dipstick LARGE (*)    Protein, ur 100 (*)    Bacteria, UA RARE (*)    Squamous Epithelial / LPF 0-5 (*)    All other components within normal limits  LIPASE, BLOOD  HCG, QUANTITATIVE, PREGNANCY  POC URINE PREG, ED  POCT PREGNANCY, URINE    RADIOLOGY  Transvaginal ultrasound   IMPRESSION: Hypoechoic mass consistent with leiomyoma in the superior uterine fundus measuring 3.0 x 2.2 x 2.7 cm. Study otherwise unremarkable.   ____________________________________________  DIFFERENTIAL DIAGNOSIS   Abnormal vaginal bleeding, ectopic, anemia, fibroid  FINAL ASSESSMENT AND PLAN  Abnormal vaginal bleeding   Plan: Patient had presented for abnormal vaginal bleeding. Patient's labs do reveal some anemia but stable from her prior testing. Patient's imaging reveals a small fibroid but otherwise unremarkable.  She will be discharged with Provera and referred to GYN for follow-up.   Earleen Newport, MD   Note: This note was generated in part or whole with voice recognition software. Voice recognition is usually quite accurate but there are transcription errors that can and very often do occur. I apologize for any typographical errors that were not detected and corrected.     Earleen Newport, MD 05/13/17 [phone]

## 2017-05-13 NOTE — ED Notes (Signed)
Patient transported to Ultrasound 

## 2017-10-26 ENCOUNTER — Emergency Department: Payer: Self-pay

## 2017-10-26 ENCOUNTER — Emergency Department
Admission: EM | Admit: 2017-10-26 | Discharge: 2017-10-26 | Disposition: A | Discharge status: Home / Self Care | Payer: Self-pay | Attending: Emergency Medicine | Admitting: Emergency Medicine

## 2017-10-26 ENCOUNTER — Other Ambulatory Visit: Payer: Self-pay

## 2017-10-26 DIAGNOSIS — F17210 Nicotine dependence, cigarettes, uncomplicated: Secondary | ICD-10-CM | POA: Insufficient documentation

## 2017-10-26 DIAGNOSIS — J40 Bronchitis, not specified as acute or chronic: Secondary | ICD-10-CM | POA: Insufficient documentation

## 2017-10-26 DIAGNOSIS — I10 Essential (primary) hypertension: Secondary | ICD-10-CM | POA: Insufficient documentation

## 2017-10-26 DIAGNOSIS — Z79899 Other long term (current) drug therapy: Secondary | ICD-10-CM | POA: Insufficient documentation

## 2017-10-26 DIAGNOSIS — R2243 Localized swelling, mass and lump, lower limb, bilateral: Secondary | ICD-10-CM | POA: Insufficient documentation

## 2017-10-26 DIAGNOSIS — R05 Cough: Secondary | ICD-10-CM | POA: Insufficient documentation

## 2017-10-26 DIAGNOSIS — J3489 Other specified disorders of nose and nasal sinuses: Secondary | ICD-10-CM | POA: Insufficient documentation

## 2017-10-26 LAB — CBC WITH DIFFERENTIAL/PLATELET
BASOS ABS: 0 10*3/uL (ref 0–0.1)
BASOS PCT: 0 %
EOS ABS: 0.1 10*3/uL (ref 0–0.7)
Eosinophils Relative: 1 %
HEMATOCRIT: 29.9 % — AB (ref 35.0–47.0)
Hemoglobin: 9.4 g/dL — ABNORMAL LOW (ref 12.0–16.0)
Lymphocytes Relative: 19 %
Lymphs Abs: 1 10*3/uL (ref 1.0–3.6)
MCH: 23.9 pg — ABNORMAL LOW (ref 26.0–34.0)
MCHC: 31.3 g/dL — ABNORMAL LOW (ref 32.0–36.0)
MCV: 76.4 fL — ABNORMAL LOW (ref 80.0–100.0)
Monocytes Absolute: 0.4 10*3/uL (ref 0.2–0.9)
Monocytes Relative: 9 %
NEUTROS ABS: 3.7 10*3/uL (ref 1.4–6.5)
NEUTROS PCT: 71 %
Platelets: 298 10*3/uL (ref 150–440)
RBC: 3.91 MIL/uL (ref 3.80–5.20)
RDW: 19.7 % — ABNORMAL HIGH (ref 11.5–14.5)
WBC: 5.2 10*3/uL (ref 3.6–11.0)

## 2017-10-26 LAB — COMPREHENSIVE METABOLIC PANEL
ALBUMIN: 3 g/dL — AB (ref 3.5–5.0)
ALK PHOS: 85 U/L (ref 38–126)
ALT: 13 U/L — ABNORMAL LOW (ref 14–54)
ANION GAP: 10 (ref 5–15)
AST: 26 U/L (ref 15–41)
BUN: 15 mg/dL (ref 6–20)
CALCIUM: 8.5 mg/dL — AB (ref 8.9–10.3)
CHLORIDE: 101 mmol/L (ref 101–111)
CO2: 26 mmol/L (ref 22–32)
CREATININE: 0.68 mg/dL (ref 0.44–1.00)
GFR calc non Af Amer: >60 mL/min (ref >60)
Glucose, Bld: 141 mg/dL — ABNORMAL HIGH (ref 65–99)
POTASSIUM: 3.5 mmol/L (ref 3.5–5.1)
Sodium: 137 mmol/L (ref 135–145)
Total Bilirubin: 0.3 mg/dL (ref 0.3–1.2)
Total Protein: 7 g/dL (ref 6.5–8.1)

## 2017-10-26 LAB — BRAIN NATRIURETIC PEPTIDE: B NATRIURETIC PEPTIDE 5: 21 pg/mL (ref 0.0–100.0)

## 2017-10-26 LAB — FIBRIN DERIVATIVES D-DIMER (ARMC ONLY): FIBRIN DERIVATIVES D-DIMER (ARMC): 561.54 ng{FEU}/mL — AB (ref 0.00–499.00)

## 2017-10-26 LAB — PROTIME-INR
INR: 0.98
PROTHROMBIN TIME: 12.9 s (ref 11.4–15.2)

## 2017-10-26 LAB — ETHANOL: Alcohol, Ethyl (B): <10 mg/dL (ref <10)

## 2017-10-26 LAB — TROPONIN I
TROPONIN I: 0.03 ng/mL — AB (ref <0.03)
Troponin I: 0.03 ng/mL (ref <0.03)

## 2017-10-26 MED ORDER — PANTOPRAZOLE SODIUM 40 MG PO TBEC
40.0000 mg | DELAYED_RELEASE_TABLET | Freq: Once | ORAL | Status: AC
  Administered 2017-10-26: 40 mg via ORAL
  Filled 2017-10-26: qty 1

## 2017-10-26 MED ORDER — PREDNISONE 20 MG PO TABS: 40.0000 mg | ORAL_TABLET | Freq: Every day | ORAL | 0 refills | Status: AC

## 2017-10-26 MED ORDER — IOHEXOL 350 MG/ML SOLN
75.0000 mL | Freq: Once | INTRAVENOUS | Status: AC | PRN
  Administered 2017-10-26: 75 mL via INTRAVENOUS

## 2017-10-26 MED ORDER — ALBUTEROL SULFATE HFA 108 (90 BASE) MCG/ACT IN AERS: 2.0000 | INHALATION_SPRAY | Freq: Four times a day (QID) | RESPIRATORY_TRACT | 2 refills | Status: DC | PRN

## 2017-10-26 MED ORDER — DOXYCYCLINE HYCLATE 100 MG PO TABS: 100.0000 mg | ORAL_TABLET | Freq: Two times a day (BID) | ORAL | 0 refills | Status: AC

## 2017-10-26 MED ORDER — ALBUTEROL SULFATE (2.5 MG/3ML) 0.083% IN NEBU
2.5000 mg | INHALATION_SOLUTION | Freq: Once | RESPIRATORY_TRACT | Status: AC
  Administered 2017-10-26: 2.5 mg via RESPIRATORY_TRACT
  Filled 2017-10-26: qty 3

## 2017-10-26 NOTE — ED Notes (Signed)
Date and time results received: 10/26/17 2040 (use smartphrase ".now" to insert current time)  Test: troponin Critical Value: 0.03  Name of Provider Notified: McShane MD  Orders Received? Or Actions Taken?: Orders Received - See Orders for details

## 2017-10-26 NOTE — ED Notes (Signed)
Patient transported to CT 

## 2017-10-26 NOTE — ED Provider Notes (Addendum)
[facility] Emergency Department Provider Note  ____________________________________________   I have reviewed the triage vital signs and the nursing notes. Where available I have reviewed prior notes and, if possible and indicated, outside hospital notes.    HISTORY  Chief Complaint Hemoptysis    HPI Lydia Hancock is a 42 y.o. 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  She denies any fever or chills.  Patient does take a BC powder every day but she takes no other anticoagulants.  S she denies any chest pain.  She has chronic mild lower extremity edema which is still present, she does not take any medications for that.  She was seen here and advised to follow-up with cardiology for that but did not.  She has no personal family history of PE or DVT.  No recent travel no unilateral leg swelling.  No recent surgery.  Denies pregnancy.     Past Medical History:  Diagnosis Date  . Anemia   . Arthritis   . Hypertension     Patient Active Problem List   Diagnosis Date Noted  . Anemia 11/15/2015    Past Surgical History:  Procedure Laterality Date  . KNEE SURGERY    . WISDOM TOOTH EXTRACTION      Prior to Admission medications   Medication Sig Start Date End Date Taking? Authorizing Provider  ferrous sulfate 325 (65 FE) MG tablet Take 1 tablet (325 mg total) by mouth 2 (two) times daily with a meal. 11/16/15   Gladstone Lighter, MD  medroxyPROGESTERone (PROVERA) 10 MG tablet Take 1 tablet (10 mg total) by mouth daily. 05/13/17   Earleen Newport, MD  omeprazole (PRILOSEC) 20 MG capsule Take 1 capsule (20 mg total) by mouth daily. Patient not taking: Reported on 05/13/2017 11/16/15   Gladstone Lighter, MD  traMADol (ULTRAM) 50 MG tablet Take 1 tablet (50 mg total) by mouth  every 6 (six) hours as needed for moderate pain or severe pain. Patient not taking: Reported on 05/13/2017 11/16/15   Gladstone Lighter, MD    Allergies Penicillins  No family history on file.  Social History Social History   Tobacco Use  . Smoking status: Current Every Day Smoker    Packs/day: 1.00    Types: Cigarettes  . Smokeless tobacco: Never Used  Substance Use Topics  . Alcohol use: Yes    Comment: Daily-bottle of jack daniels   . Drug use: No    Review of Systems Constitutional: No fever/chills Eyes: No visual changes. ENT: No sore throat. No stiff neck no neck pain Cardiovascular: Denies chest pain. Respiratory: See HPI. Gastrointestinal:   no vomiting.  No diarrhea.  No constipation. Genitourinary: Negative for dysuria. Musculoskeletal: Positive mild chronic lower extremity swelling Skin: Negative for rash. Neurological: Negative for severe headaches, focal weakness or numbness.   ____________________________________________   PHYSICAL EXAM:  VITAL SIGNS: ED Triage Vitals  Enc Vitals Group     BP 10/26/17 1914 (!) 143/52     Pulse Rate 10/26/17 1914 (!) 114     Resp --      Temp 10/26/17 1921 98.2 F (36.8 C)     Temp Source 10/26/17 1921 Oral     SpO2 10/26/17 1914 97 %     Weight 10/26/17 1916 217 lb (98.4 kg)  Height 10/26/17 1916 5\' 2"  (1.575 m)     Head Circumference --      Peak Flow --      Pain Score 10/26/17 1915 7     Pain Loc --      Pain Edu? --      Excl. in [place]? --     Constitutional: Alert and oriented. Well appearing and in no acute distress. Eyes: Conjunctivae are normal Head: Atraumatic HEENT: No congestion/rhinnorhea. Mucous membranes are moist.  Oropharynx non-erythematous Neck:   Nontender with no meningismus, no masses, no stridor Cardiovascular: Normal rate, regular rhythm. Grossly normal heart sounds.  Good peripheral circulation. Respiratory: Normal respiratory effort.  No retractions. Lungs really diminished in the  bases, Abdominal: Soft and nontender. No distention. No guarding no rebound Back:  There is no focal tenderness or step off.  there is no midline tenderness there are no lesions noted. there is no CVA tenderness Musculoskeletal: No lower extremity tenderness, no upper extremity tenderness. No joint effusions, no DVT signs strong distal pulses trace bilateral ankle edema no pitting symmetric Neurologic:  Normal speech and language. No gross focal neurologic deficits are appreciated.  Skin:  Skin is warm, dry and intact. No rash noted. Psychiatric: Mood and affect are anxious. Speech and behavior are normal.  ____________________________________________   LABS (all labs ordered are listed, but only abnormal results are displayed)  Labs Reviewed  CBC WITH DIFFERENTIAL/PLATELET  TROPONIN I  BRAIN NATRIURETIC PEPTIDE  PROTIME-INR  COMPREHENSIVE METABOLIC PANEL  ETHANOL  FIBRIN DERIVATIVES D-DIMER ([place])  POC URINE PREG, ED    Pertinent labs  results that were available during my care of the patient were reviewed by me and considered in my medical decision making (see chart for details). ____________________________________________  EKG  I personally interpreted any EKGs ordered by me or triage Sinus tachycardia rate 110, no acute ST elevation or depression ossific ST changes normal axis ____________________________________________  RADIOLOGY  Pertinent labs & imaging results that were available during my care of the patient were reviewed by me and considered in my medical decision making (see chart for details). If possible, patient and/or family made aware of any abnormal findings.  No results found. ____________________________________________    PROCEDURES  Procedure(s) performed: None  Procedures  Critical Care performed: None  ____________________________________________   INITIAL IMPRESSION / ASSESSMENT AND PLAN / ED COURSE  Pertinent labs & imaging results  that were available during my care of the patient were reviewed by me and considered in my medical decision making (see chart for details).  She is here with cough and rhinorrhea and hemoptysis reported.  Very stable in appearance heart rate is somewhat elevated.  Differential is broad most likely is of bronchitic process however, differentials include CHF with patient's chronic lower extremity edema as well as PE.  I cannot use the PERC criteria to rule out PE because she is mildly tachycardic which may be in some ways secondary anxiety, in any event, we will send a d-dimer as she does not have any significant risk factors for PE.  Hopefully d-dimer enough to settle the issue, we will obtain chest x-ray BNP is CHF could cause this, patient is not on birth control has not been sexually active for 4 years, denies pregnancy.  ----------------------------------------- 8:36 PM on 10/26/2017 -----------------------------------------  Signed out to dr. Quentin Cornwall at the end of my shift.   ____________________________________________   FINAL CLINICAL IMPRESSION(S) / ED DIAGNOSES  Final diagnoses:  None  This chart was dictated using voice recognition software.  Despite best efforts to proofread,  errors can occur which can change meaning.      Schuyler Amor, MD 10/26/17 2003    Schuyler Amor, MD 10/26/17 2036

## 2017-10-26 NOTE — ED Triage Notes (Signed)
Pt comes via [facility] with c/o coughing up blood for 3 days and worse today after mowing the yard. Pt also states hx of anemia. Pt states  Lower extremity leg swelling also. EMS states  BP-150/100, HR-118, 98%

## 2017-10-26 NOTE — ED Provider Notes (Signed)
Patient received in sign-out from Dr. Burlene Arnt.  Workup and evaluation pending CT imaging repeat enzymes.  CT imaging does not show any evidence of PE but there is bilateral infiltrate concerning for possible pneumonitis.  Based on her presentation I do have a higher suspicion for bronchitis.  I will start the patient on bronchodilators as well as steroids and doxycycline.  Currently awaiting repeat troponin.  Assuming the repeat troponin is stable patient will be appropriate for outpatient follow-up.  Have discussed with the patient and available family all diagnostics and treatments performed thus far and all questions were answered to the best of my ability. The patient demonstrates understanding and agreement with plan. Merlyn Lot, MD 10/26/17 [phone]

## 2020-01-26 ENCOUNTER — Emergency Department: Payer: Medicaid Other

## 2020-01-26 ENCOUNTER — Other Ambulatory Visit: Payer: Self-pay

## 2020-01-26 ENCOUNTER — Observation Stay
Admission: EM | Admit: 2020-01-26 | Discharge: 2020-01-28 | Disposition: A | Discharge status: Home / Self Care | Payer: Medicaid Other | Attending: Internal Medicine | Admitting: Internal Medicine

## 2020-01-26 DIAGNOSIS — R739 Hyperglycemia, unspecified: Secondary | ICD-10-CM

## 2020-01-26 DIAGNOSIS — F17210 Nicotine dependence, cigarettes, uncomplicated: Secondary | ICD-10-CM | POA: Insufficient documentation

## 2020-01-26 DIAGNOSIS — Z6841 Body Mass Index (BMI) 40.0 and over, adult: Secondary | ICD-10-CM

## 2020-01-26 DIAGNOSIS — D649 Anemia, unspecified: Principal | ICD-10-CM | POA: Diagnosis present

## 2020-01-26 DIAGNOSIS — N939 Abnormal uterine and vaginal bleeding, unspecified: Secondary | ICD-10-CM

## 2020-01-26 DIAGNOSIS — Z20822 Contact with and (suspected) exposure to covid-19: Secondary | ICD-10-CM | POA: Insufficient documentation

## 2020-01-26 DIAGNOSIS — Z23 Encounter for immunization: Secondary | ICD-10-CM | POA: Insufficient documentation

## 2020-01-26 DIAGNOSIS — I10 Essential (primary) hypertension: Secondary | ICD-10-CM | POA: Insufficient documentation

## 2020-01-26 LAB — CBC WITH DIFFERENTIAL/PLATELET
Abs Immature Granulocytes: 0.03 10*3/uL (ref 0.00–0.07)
Basophils Absolute: 0 10*3/uL (ref 0.0–0.1)
Basophils Relative: 0 %
Eosinophils Absolute: 0.1 10*3/uL (ref 0.0–0.5)
Eosinophils Relative: 1 %
HCT: 17 % — ABNORMAL LOW (ref 36.0–46.0)
Hemoglobin: 4.1 g/dL — CL (ref 12.0–15.0)
Immature Granulocytes: 1 %
Lymphocytes Relative: 16 %
Lymphs Abs: 1 10*3/uL (ref 0.7–4.0)
MCH: 16.3 pg — ABNORMAL LOW (ref 26.0–34.0)
MCHC: 24.1 g/dL — ABNORMAL LOW (ref 30.0–36.0)
MCV: 67.7 fL — ABNORMAL LOW (ref 80.0–100.0)
Monocytes Absolute: 0.5 10*3/uL (ref 0.1–1.0)
Monocytes Relative: 8 %
Neutro Abs: 4.6 10*3/uL (ref 1.7–7.7)
Neutrophils Relative %: 74 %
Platelets: 362 10*3/uL (ref 150–400)
RBC: 2.51 MIL/uL — ABNORMAL LOW (ref 3.87–5.11)
RDW: 18.8 % — ABNORMAL HIGH (ref 11.5–15.5)
Smear Review: NORMAL
WBC: 6.3 10*3/uL (ref 4.0–10.5)
nRBC: 0 % (ref 0.0–0.2)

## 2020-01-26 LAB — IRON AND TIBC
Iron: 48 ug/dL (ref 28–170)
Saturation Ratios: 8 % — ABNORMAL LOW (ref 10.4–31.8)
TIBC: 623 ug/dL — ABNORMAL HIGH (ref 250–450)
UIBC: 575 ug/dL

## 2020-01-26 LAB — URINALYSIS, ROUTINE W REFLEX MICROSCOPIC
Bilirubin Urine: NEGATIVE
Glucose, UA: NEGATIVE mg/dL
Hgb urine dipstick: NEGATIVE
Ketones, ur: NEGATIVE mg/dL
Leukocytes,Ua: NEGATIVE
Nitrite: NEGATIVE
Protein, ur: NEGATIVE mg/dL
Specific Gravity, Urine: 1.014 (ref 1.005–1.030)
pH: 6 (ref 5.0–8.0)

## 2020-01-26 LAB — COMPREHENSIVE METABOLIC PANEL
ALT: 13 U/L (ref 0–44)
AST: 17 U/L (ref 15–41)
Albumin: 3.5 g/dL (ref 3.5–5.0)
Alkaline Phosphatase: 72 U/L (ref 38–126)
Anion gap: 11 (ref 5–15)
BUN: 7 mg/dL (ref 6–20)
CO2: 25 mmol/L (ref 22–32)
Calcium: 8.4 mg/dL — ABNORMAL LOW (ref 8.9–10.3)
Chloride: 98 mmol/L (ref 98–111)
Creatinine, Ser: 0.54 mg/dL (ref 0.44–1.00)
GFR calc Af Amer: >60 mL/min (ref >60)
GFR calc non Af Amer: >60 mL/min (ref >60)
Glucose, Bld: 170 mg/dL — ABNORMAL HIGH (ref 70–99)
Potassium: 3.6 mmol/L (ref 3.5–5.1)
Sodium: 134 mmol/L — ABNORMAL LOW (ref 135–145)
Total Bilirubin: 0.7 mg/dL (ref 0.3–1.2)
Total Protein: 7.6 g/dL (ref 6.5–8.1)

## 2020-01-26 LAB — LIPASE, BLOOD: Lipase: 24 U/L (ref 11–51)

## 2020-01-26 LAB — RESPIRATORY PANEL BY RT PCR (FLU A&B, COVID)
Influenza A by PCR: NEGATIVE
Influenza B by PCR: NEGATIVE
SARS Coronavirus 2 by RT PCR: NEGATIVE

## 2020-01-26 LAB — PREPARE RBC (CROSSMATCH)

## 2020-01-26 LAB — POCT PREGNANCY, URINE: Preg Test, Ur: NEGATIVE

## 2020-01-26 MED ORDER — TIZANIDINE HCL 4 MG PO TABS
4.0000 mg | ORAL_TABLET | Freq: Once | ORAL | Status: AC
  Administered 2020-01-27: 4 mg via ORAL
  Filled 2020-01-26: qty 1

## 2020-01-26 MED ORDER — SODIUM CHLORIDE 0.9 % IV SOLN
10.0000 mL/h | Freq: Once | INTRAVENOUS | Status: AC
  Administered 2020-01-26: 10 mL/h via INTRAVENOUS

## 2020-01-26 NOTE — ED Provider Notes (Signed)
[facility] Emergency Department Provider Note ____________________________________________   First MD Initiated Contact with Patient 01/26/20 1914     (approximate)  I have reviewed the triage vital signs and the nursing notes.   HISTORY  Chief Complaint Anemia    HPI Lydia Hancock is a 44 y.o. female with history of fibroid uterus, anemia, and other PMH as noted below who presents with abnormal vaginal bleeding over approximately last month, consisting of passing large clots, and now resolved 4 days ago.  The patient reports associated generalized weakness and shortness of breath, which is similar to how she has felt before when she has become very anemic.  She last required a blood transfusion several years ago.  She states that the last time she had imaging of her uterus was in 2019.  She denies any continuous abdominal pain.  She has no vomiting or diarrhea.  She has no other abnormal bleeding or bruising, although states that when she was strained to have a bowel movement there was a small amount of blood with her stool and some sharp pain which she attributes to an anal fissure.  She has no persistent blood in the stool.   Past Medical History:  Diagnosis Date  . Anemia   . Arthritis   . Hypertension     Patient Active Problem List   Diagnosis Date Noted  . Anemia 11/15/2015    Past Surgical History:  Procedure Laterality Date  . KNEE SURGERY    . WISDOM TOOTH EXTRACTION      Prior to Admission medications   Medication Sig Start Date End Date Taking? Authorizing Provider  albuterol (PROVENTIL HFA;VENTOLIN HFA) 108 (90 Base) MCG/ACT inhaler Inhale 2 puffs into the lungs every 6 (six) hours as needed for wheezing or shortness of breath. 10/26/17   Merlyn Lot, MD  Aspirin-Salicylamide-Caffeine [facility] HEADACHE POWDER PO) Take 1 Dose by mouth daily.    [provider]  medroxyPROGESTERone (PROVERA) 10 MG tablet Take 1 tablet (10 mg  total) by mouth daily. Patient not taking: Reported on 10/26/2017 05/13/17   Earleen Newport, MD    Allergies Penicillins  No family history on file.  Social History Social History   Tobacco Use  . Smoking status: Current Every Day Smoker    Packs/day: 1.00    Types: Cigarettes  . Smokeless tobacco: Never Used  Vaping Use  . Vaping Use: Never used  Substance Use Topics  . Alcohol use: Yes    Comment: Daily-bottle of jack daniels   . Drug use: No    Review of Systems  Constitutional: No fever/chills.  Positive for generalized weakness. Eyes: No visual changes. ENT: No sore throat. Cardiovascular: Denies chest pain. Respiratory: Positive for shortness of breath. Gastrointestinal: No vomiting or diarrhea.  Genitourinary: Negative for dysuria.  Positive for vaginal bleeding. Musculoskeletal: Negative for back pain. Skin: Negative for rash. Neurological: Negative for headache.   ____________________________________________   PHYSICAL EXAM:  VITAL SIGNS: ED Triage Vitals  Enc Vitals Group     BP 01/26/20 1826 131/65     Pulse Rate 01/26/20 1826 (!) 107     Resp 01/26/20 1826 16     Temp 01/26/20 1826 98.1 F (36.7 C)     Temp Source 01/26/20 1826 Oral     SpO2 01/26/20 1826 100 %     Weight 01/26/20 1827 275 lb (124.7 kg)     Height 01/26/20 1827 5\' 2"  (1.575 m)     Head  Circumference --      Peak Flow --      Pain Score 01/26/20 1826 7     Pain Loc --      Pain Edu? --      Excl. in [place]? --     Constitutional: Alert and oriented.  Slightly weak appearing but in no acute distress. Eyes: Conjunctivae are pale. Head: Atraumatic. Nose: No congestion/rhinnorhea. Mouth/Throat: Mucous membranes are moist.   Neck: Normal range of motion.  Cardiovascular: Tachycardic, regular rhythm.  Good peripheral circulation. Respiratory: Normal respiratory effort.  No retractions.  Gastrointestinal: Soft and nontender. No distention.  Genitourinary: No flank  tenderness. Musculoskeletal: Extremities warm and well perfused.  Neurologic:  Normal speech and language. No gross focal neurologic deficits are appreciated.  Skin:  Skin is warm and dry. No rash noted. Psychiatric: Mood and affect are normal. Speech and behavior are normal.  ____________________________________________   LABS (all labs ordered are listed, but only abnormal results are displayed)  Labs Reviewed  COMPREHENSIVE METABOLIC PANEL - Abnormal; Notable for the following components:      Result Value   Sodium 134 (*)    Glucose, Bld 170 (*)    Calcium 8.4 (*)    All other components within normal limits  CBC WITH DIFFERENTIAL/PLATELET - Abnormal; Notable for the following components:   RBC 2.51 (*)    Hemoglobin 4.1 (*)    HCT 17.0 (*)    MCV 67.7 (*)    MCH 16.3 (*)    MCHC 24.1 (*)    RDW 18.8 (*)    All other components within normal limits  URINALYSIS, ROUTINE W REFLEX MICROSCOPIC - Abnormal; Notable for the following components:   Color, Urine YELLOW (*)    APPearance HAZY (*)    All other components within normal limits  RESPIRATORY PANEL BY RT PCR (FLU A&B, COVID)  LIPASE, BLOOD  POC URINE PREG, ED  POCT PREGNANCY, URINE  TYPE AND SCREEN  PREPARE RBC (CROSSMATCH)  TYPE AND SCREEN   ____________________________________________  EKG   ____________________________________________  RADIOLOGY  US pelvis: Uterine fibroid unchanged from 2019  ____________________________________________   PROCEDURES  Procedure(s) performed: No  Procedures  Critical Care performed: No ____________________________________________   INITIAL IMPRESSION / ASSESSMENT AND PLAN / ED COURSE  Pertinent labs & imaging results that were available during my care of the patient were reviewed by me and considered in my medical decision making (see chart for details).  44 year old female with PMH as noted above including uterine fibroids and anemia presents with a  prolonged episode of vaginal bleeding which resolved several days ago, and associated symptoms of generalized weakness and shortness of breath which feel like exacerbations of her anemia.  I reviewed the past medical records in Epic; the patient was last admitted in 2017 with anemia due to dysmenorrhea.  Most recent ultrasound in 2019 shows a 3 cm fibroid.  On exam today, the patient is slightly tachycardic with otherwise normal vital signs.  She appears pale and somewhat weak, but in no acute distress.  The abdomen is soft and nontender.  Initial lab work-up reveals a hemoglobin of 4.1, which is a significant drop for the patient.  Other labs are within normal limits.  The patient will need transfusion of PRBCs and admission.  I will also order a pelvic ultrasound to evaluate for any change in her fibroids or other new pathology.  ----------------------------------------- 10:20 PM on 01/26/2020 -----------------------------------------  Ultrasound shows no significant changes from her most  recent study in 2019. The patient remains hemodynamically stable with improvement in her heart rate. I discussed the case with Dr. Flossie Buffy from the hospitalist service for admission.  ____________________________________________   FINAL CLINICAL IMPRESSION(S) / ED DIAGNOSES  Final diagnoses:  Abnormal vaginal bleeding  Anemia, unspecified type      NEW MEDICATIONS STARTED DURING THIS VISIT:  New Prescriptions   No medications on file     Note:  This document was prepared using Dragon voice recognition software and may include unintentional dictation errors.    Arta Silence, MD 01/26/20 2220

## 2020-01-26 NOTE — ED Triage Notes (Addendum)
Pt here with anemia. Aug 14 her period started she has been passing clots until 4 days ago. Pt c/o cramping and not eating and drinking for several days. Pt fears that she is bleeding into her uterus, similar pain the last time that it happened. Pt NAD in triage.

## 2020-01-26 NOTE — H&P (Signed)
History and Physical    Lydia Hancock JJK:[id number] DOB: August 18, 1975 DOA: 01/26/2020  PCP: Patient, No Pcp Per  Patient coming from: Home  I have personally briefly reviewed patient's old medical records in [place]  Chief Complaint: dizziness and increasing shortness of breath  HPI: Lydia Hancock is a 44 y.o. female with medical history significant for abnormal uterine bleeding, anemia who presents with concerns of dizziness and dyspnea on exertion.  Patient has been having abnormal uterine bleeding for the past 4 to 5-year.  States she would have prolonged menses and they would stop for a few days and then resumed.  Most recently she started having bleeding mid August that has continued up until about 4 days ago.  States it was heavy requiring her to use diapers as it would soak through tampons and heavy menstrual pads.  Also noticed passing of clots.  She has not been to see a gynecologist due to lack of insurance.  Has only been trying to take over-the-counter iron pills at home.  For the past few days she has noticed dizziness and then today decided to present to the ED since she has noted increasing dyspnea on exertion when ambulating short distances. Last had similar symptoms requiring transfusion back in 2019 and at that time she had hypoechoic mass consistent with leiomyoma in the superior uterine fundus on pelvic ultrasound.  ED Course: She was afebrile, tachycardic but normotensive on room air. Lab work notable for hemoglobin of 4.1.  Last hemoglobin was about 2 years ago at 9.4.  Electrolytes otherwise mostly unremarkable.  Transvaginal ultrasound shows subserosal uterine fibroid in the anterior fundus similar to prior exam.  Review of Systems: Constitutional: No Weight Change, No Fever ENT/Mouth: No sore throat, No Rhinorrhea Eyes: No Eye Pain, No Vision Changes Cardiovascular: No Chest Pain, +SOB Respiratory: No Cough, No Sputum, No Wheezing, no Dyspnea   Gastrointestinal: No Nausea, No Vomiting, No Diarrhea, No Constipation, + Pain RLQ Genitourinary: no Urinary Incontinence,  Musculoskeletal: No Arthralgias, No Myalgias Skin: No Skin Lesions, No Pruritus, Neuro: + Weakness, No Numbness,  No Loss of Consciousness, No Syncope Psych: No Anxiety/Panic, No Depression, no decrease appetite Heme/Lymph: No Bruising, No Bleeding  Past Medical History:  Diagnosis Date  . Anemia   . Arthritis   . Hypertension     Past Surgical History:  Procedure Laterality Date  . KNEE SURGERY    . WISDOM TOOTH EXTRACTION       reports that she has been smoking cigarettes. She has been smoking about 1.00 pack per day. She has never used smokeless tobacco. She reports current alcohol use. She reports that she does not use drugs. Social History  Allergies  Allergen Reactions  . Penicillins Anaphylaxis    Has patient had a PCN reaction causing immediate rash, facial/tongue/throat swelling, SOB or lightheadedness with hypotension: Yes Has patient had a PCN reaction causing severe rash involving mucus membranes or skin necrosis: No Has patient had a PCN reaction that required hospitalization No Has patient had a PCN reaction occurring within the last 10 years: No If all of the above answers are "NO", then may proceed with Cephalosporin use.       Prior to Admission medications   Medication Sig Start Date End Date Taking? Authorizing Provider  Aspirin-Salicylamide-Caffeine (BC HEADACHE POWDER PO) Take 1 Dose by mouth daily.   Yes [provider]  albuterol (PROVENTIL HFA;VENTOLIN HFA) 108 (90 Base) MCG/ACT inhaler Inhale 2 puffs into the lungs every  6 (six) hours as needed for wheezing or shortness of breath. 10/26/17   Merlyn Lot, MD    Physical Exam: Vitals:   01/26/20 2000 01/26/20 2120 01/26/20 2135 01/26/20 2243  BP: 114/65 119/70 121/71 (!) 143/78  Pulse: (!) 103 (!) 103 98 (!) 103  Resp:  18 16 16   Temp:  97.9 F (36.6 C)  98.1 F (36.7 C) 98.9 F (37.2 C)  TempSrc:  Oral Oral   SpO2: 100% 100% 99% 100%  Weight:      Height:        Constitutional: NAD, calm, comfortable, morbidly obese female laying flat asleep in bed Vitals:   01/26/20 2000 01/26/20 2120 01/26/20 2135 01/26/20 2243  BP: 114/65 119/70 121/71 (!) 143/78  Pulse: (!) 103 (!) 103 98 (!) 103  Resp:  18 16 16   Temp:  97.9 F (36.6 C) 98.1 F (36.7 C) 98.9 F (37.2 C)  TempSrc:  Oral Oral   SpO2: 100% 100% 99% 100%  Weight:      Height:       Eyes: PERRL, lids and conjunctivae normal ENMT: Mucous membranes are moist. Neck: normal, supple Respiratory: clear to auscultation bilaterally, no wheezing, no crackles. Normal respiratory effort.  Cardiovascular: Regular rate and rhythm, no murmurs / rubs / gallops. No extremity edema. .  Abdomen: no tenderness, no masses palpated.  Bowel sounds positive.  Musculoskeletal: no clubbing / cyanosis. No joint deformity upper and lower extremities. Good ROM, no contractures. Normal muscle tone.  Skin: no rashes, lesions, ulcers. No induration Neurologic: CN 2-12 grossly intact. Sensation intact, Strength 5/5 in all 4.  Psychiatric: Normal judgment and insight. Alert and oriented x 3. Normal mood.     Labs on Admission: I have personally reviewed following labs and imaging studies  CBC: Recent Labs  Lab 01/26/20 1833  WBC 6.3  NEUTROABS 4.6  HGB 4.1*  HCT 17.0*  MCV 67.7*  PLT 841   Basic Metabolic Panel: Recent Labs  Lab 01/26/20 1833  NA 134*  K 3.6  CL 98  CO2 25  GLUCOSE 170*  BUN 7  CREATININE 0.54  CALCIUM 8.4*   GFR: Estimated Creatinine Clearance: 113.2 mL/min (by C-G formula based on SCr of 0.54 mg/dL). Liver Function Tests: Recent Labs  Lab 01/26/20 1833  AST 17  ALT 13  ALKPHOS 72  BILITOT 0.7  PROT 7.6  ALBUMIN 3.5   Recent Labs  Lab 01/26/20 1833  LIPASE 24   No results for input(s): AMMONIA in the last 168 hours. Coagulation Profile: No results  for input(s): INR, PROTIME in the last 168 hours. Cardiac Enzymes: No results for input(s): CKTOTAL, CKMB, CKMBINDEX, TROPONINI in the last 168 hours. BNP (last 3 results) No results for input(s): PROBNP in the last 8760 hours. HbA1C: No results for input(s): HGBA1C in the last 72 hours. CBG: No results for input(s): GLUCAP in the last 168 hours. Lipid Profile: No results for input(s): CHOL, HDL, LDLCALC, TRIG, CHOLHDL, LDLDIRECT in the last 72 hours. Thyroid Function Tests: No results for input(s): TSH, T4TOTAL, FREET4, T3FREE, THYROIDAB in the last 72 hours. Anemia Panel: No results for input(s): VITAMINB12, FOLATE, FERRITIN, TIBC, IRON, RETICCTPCT in the last 72 hours. Urine analysis:    Component Value Date/Time   COLORURINE YELLOW (A) 01/26/2020 1836   APPEARANCEUR HAZY (A) 01/26/2020 1836   LABSPEC 1.014 01/26/2020 1836   PHURINE 6.0 01/26/2020 1836   GLUCOSEU NEGATIVE 01/26/2020 1836   HGBUR NEGATIVE 01/26/2020 1836   BILIRUBINUR NEGATIVE  01/26/2020 1836   KETONESUR NEGATIVE 01/26/2020 1836   PROTEINUR NEGATIVE 01/26/2020 1836   UROBILINOGEN 2.0 (H) 06/29/2011 0919   NITRITE NEGATIVE 01/26/2020 1836   LEUKOCYTESUR NEGATIVE 01/26/2020 1836    Radiological Exams on Admission: US PELVIC COMPLETE WITH TRANSVAGINAL  Result Date: 01/26/2020 CLINICAL DATA:  Abnormal vaginal bleeding. EXAM: TRANSABDOMINAL AND TRANSVAGINAL ULTRASOUND OF PELVIS TECHNIQUE: Both transabdominal and transvaginal ultrasound examinations of the pelvis were performed. Transabdominal technique was performed for global imaging of the pelvis including uterus, ovaries, adnexal regions, and pelvic cul-de-sac. It was necessary to proceed with endovaginal exam following the transabdominal exam to visualize the endometrium and adnexa. COMPARISON:  Pelvic ultrasound 05/13/2017 FINDINGS: Uterus Measurements: 8.1 x 5.0 x 4.3 cm = volume: 90 mL. Hypoechoic subserosal fibroid in the anterior fundus measuring 2.7 x 2.5 x  2.4 cm, similar appearance to prior exam. Endometrium Thickness: 5 mm, normal.  No focal abnormality visualized. Right ovary Measurements: 2.4 x 1.6 x 2.6 cm = volume: 5 mL. Normal appearance with blood flow. No ovarian or adnexal mass. Left ovary Measurements: 2.3 x 1.6 x 1.8 cm = volume: 3 mL. Normal appearance with blood flow. No ovarian or adnexal mass. Other findings No abnormal free fluid. IMPRESSION: 1. Subserosal uterine fibroid in the anterior fundus measuring 2.7 cm, similar in appearance to prior exam. 2. Normal sonographic appearance of the endometrium and ovaries. If bleeding remains unresponsive to hormonal or medical therapy, sonohysterogram should be considered for focal lesion work-up. (Ref: Radiological Reasoning: Algorithmic Workup of Abnormal Vaginal Bleeding with Endovaginal Sonography and Sonohysterography. AJR 2008; [id number]) Electronically Signed   By: Keith Rake M.D.   On: 01/26/2020 20:56      Assessment/Plan  Anemia secondary to abnormal uterine bleeding from uterine fibroid  Hgb of 4.1- last lab in 2019 at 9.4 Getting 2 units of pRBC and will recheck CBC afterwards- goal of Hgb of 7  Check iron panel- suspect will need IV infusion before discharge recommend norethindrone at discharge. TOCM consult to help with establishing care with GYN due to lack of insurance   Hyperglycemia  Has hx of gestational diabetes Check HbA1C once anemia improves so measurement can be accurate  Morbid obesity BMI 50  DVT prophylaxis: SCDs Code Status: Full Family Communication: Plan discussed with patient at bedside  disposition Plan: Home with observation Consults called:  Admission status: Observation  Status is: Observation  The patient remains OBS appropriate and will d/c before 2 midnights.  Dispo: The patient is from: Home              Anticipated d/c is to: Home              Anticipated d/c date is: 1 day              Patient currently is not medically stable to  d/c.         Orene Desanctis DO [facility]   If 7PM-7AM, please contact night-coverage [email or web address]   01/26/2020, 10:53 PM

## 2020-01-26 NOTE — ED Notes (Signed)
Patient transported to ultrasound.

## 2020-01-27 ENCOUNTER — Encounter: Payer: Self-pay | Admitting: Family Medicine

## 2020-01-27 ENCOUNTER — Other Ambulatory Visit: Payer: Self-pay

## 2020-01-27 DIAGNOSIS — D649 Anemia, unspecified: Principal | ICD-10-CM

## 2020-01-27 LAB — CBC
HCT: 20.5 % — ABNORMAL LOW (ref 36.0–46.0)
Hemoglobin: 5.9 g/dL — ABNORMAL LOW (ref 12.0–15.0)
MCH: 20.1 pg — ABNORMAL LOW (ref 26.0–34.0)
MCHC: 28.8 g/dL — ABNORMAL LOW (ref 30.0–36.0)
MCV: 70 fL — ABNORMAL LOW (ref 80.0–100.0)
Platelets: 290 10*3/uL (ref 150–400)
RBC: 2.93 MIL/uL — ABNORMAL LOW (ref 3.87–5.11)
RDW: 22.5 % — ABNORMAL HIGH (ref 11.5–15.5)
WBC: 7.1 10*3/uL (ref 4.0–10.5)
nRBC: 0 % (ref 0.0–0.2)

## 2020-01-27 LAB — TSH: TSH: 1.026 u[IU]/mL (ref 0.350–4.500)

## 2020-01-27 LAB — BASIC METABOLIC PANEL
Anion gap: 10 (ref 5–15)
BUN: 5 mg/dL — ABNORMAL LOW (ref 6–20)
CO2: 27 mmol/L (ref 22–32)
Calcium: 8.1 mg/dL — ABNORMAL LOW (ref 8.9–10.3)
Chloride: 100 mmol/L (ref 98–111)
Creatinine, Ser: 0.55 mg/dL (ref 0.44–1.00)
GFR calc Af Amer: >60 mL/min (ref >60)
GFR calc non Af Amer: >60 mL/min (ref >60)
Glucose, Bld: 138 mg/dL — ABNORMAL HIGH (ref 70–99)
Potassium: 3.7 mmol/L (ref 3.5–5.1)
Sodium: 137 mmol/L (ref 135–145)

## 2020-01-27 LAB — PREPARE RBC (CROSSMATCH)

## 2020-01-27 LAB — HEMOGLOBIN A1C
Hgb A1c MFr Bld: 6.7 % — ABNORMAL HIGH (ref 4.8–5.6)
Mean Plasma Glucose: 145.59 mg/dL

## 2020-01-27 LAB — HIV ANTIBODY (ROUTINE TESTING W REFLEX): HIV Screen 4th Generation wRfx: NONREACTIVE

## 2020-01-27 LAB — HEMOGLOBIN AND HEMATOCRIT, BLOOD
HCT: 24.7 % — ABNORMAL LOW (ref 36.0–46.0)
Hemoglobin: 7.4 g/dL — ABNORMAL LOW (ref 12.0–15.0)

## 2020-01-27 LAB — MAGNESIUM: Magnesium: 1.8 mg/dL (ref 1.7–2.4)

## 2020-01-27 MED ORDER — ACETAMINOPHEN 325 MG PO TABS
ORAL_TABLET | ORAL | Status: AC
  Filled 2020-01-27: qty 2

## 2020-01-27 MED ORDER — ACETAMINOPHEN 325 MG PO TABS
650.0000 mg | ORAL_TABLET | Freq: Four times a day (QID) | ORAL | Status: DC | PRN
  Administered 2020-01-27: 650 mg via ORAL

## 2020-01-27 MED ORDER — FERROUS SULFATE 325 (65 FE) MG PO TBEC: 325.0000 mg | DELAYED_RELEASE_TABLET | Freq: Two times a day (BID) | ORAL | 3 refills | Status: DC

## 2020-01-27 MED ORDER — MAGNESIUM OXIDE 400 (241.3 MG) MG PO TABS
400.0000 mg | ORAL_TABLET | Freq: Once | ORAL | Status: AC
  Administered 2020-01-27: 400 mg via ORAL
  Filled 2020-01-27: qty 1

## 2020-01-27 MED ORDER — SODIUM CHLORIDE 0.9% IV SOLUTION: Freq: Once | INTRAVENOUS | Status: DC

## 2020-01-27 MED ORDER — KETOROLAC TROMETHAMINE 30 MG/ML IJ SOLN
30.0000 mg | Freq: Once | INTRAMUSCULAR | Status: AC
  Administered 2020-01-27: 30 mg via INTRAVENOUS
  Filled 2020-01-27: qty 1

## 2020-01-27 MED ORDER — INFLUENZA VAC SPLIT QUAD 0.5 ML IM SUSY
0.5000 mL | PREFILLED_SYRINGE | INTRAMUSCULAR | Status: AC
  Administered 2020-01-28: 0.5 mL via INTRAMUSCULAR
  Filled 2020-01-27: qty 0.5

## 2020-01-27 MED ORDER — METHOCARBAMOL 750 MG PO TABS
750.0000 mg | ORAL_TABLET | Freq: Once | ORAL | Status: AC
  Administered 2020-01-27: 750 mg via ORAL
  Filled 2020-01-27: qty 1

## 2020-01-27 NOTE — Progress Notes (Signed)
Patient unable to leave hospital this evening if discharged due to lack of ride to home 1.5 hours away. MD Reesa Chew has placed orders for patient to be discharged after H&H labs are drawn and hemoglobin reaches 7. I notified MD Reesa Chew about patient's situation, MD informed me to discharge patient at time of ride availability. Currently waiting for labs to be drawn and result. Patient states she is weak and fatigued as she was when arriving to hospital. Patient was given tylenol and tramadol for headache during second blood unit administration. She states relief of headache to pain scale of 3 at this time.

## 2020-01-27 NOTE — Discharge Summary (Addendum)
Physician Discharge Summary  Lydia Hancock OVF:[id number] DOB: Oct 26, 1975 DOA: 01/26/2020  PCP: Patient, No Pcp Per  Admit date: 01/26/2020 Discharge date: 01/27/2020  Admitted From: Home Disposition: Home  Recommendations for Outpatient Follow-up:  1. Follow up with PCP in 1-2 weeks 2. Please obtain BMP/CBC in one week 3. Please follow up on the following pending results: None  Home Health: No Equipment/Devices: None Discharge Condition: Stable CODE STATUS: Full Diet recommendation: Heart Healthy / Carb Modified   Brief/Interim Summary: Lydia Hancock is a 44 y.o. female with medical history significant for abnormal uterine bleeding, anemia who presents with concerns of dizziness and dyspnea on exertion.  Patient has been having abnormal uterine bleeding for the past 4 to 5-year.  States she would have prolonged menses and they would stop for a few days and then resumed.  Most recently she started having bleeding mid August that has continued up until about 4 days ago.  States it was heavy requiring her to use diapers as it would soak through tampons and heavy menstrual pads.  Also noticed passing of clots.  She has not been to see a gynecologist due to lack of insurance.  Has only been trying to take over-the-counter iron pills at home.  For the past few days she has noticed dizziness and then today decided to present to the ED since she has noted increasing dyspnea on exertion when ambulating short distances. Last had similar symptoms requiring transfusion back in 2019 and at that time she had hypoechoic mass consistent with leiomyoma in the superior uterine fundus on pelvic ultrasound.  She was found to have an hemoglobin of 4.1.  Transvaginal ultrasound shows subserosal uterine fibroid in the anterior fundus similar to the prior exam.  Patient received 4 units of PRBC and her hemoglobin on discharge was 7.4. She was provided with iron supplement. We talked with Dr. Amalia Hailey from  gynecology and he will be happy to see her in his clinic on Monday.  Patient was provided with informations.  On initial labs her blood glucose levels were elevated.  Patient has an history of gestational diabetes.  A1c checked and it was 6.7.  Patient needs to follow-up with her primary care provider and watch her diet for further management of her diabetes.  Discharge Diagnoses:  Principal Problem:   Acute anemia Active Problems:   Hyperglycemia   BMI 50.0-59.9, adult [facility])  Discharge Instructions  Discharge Instructions    Diet - low sodium heart healthy   Complete by: As directed    Discharge instructions   Complete by: As directed    It was pleasure taking care of you. You are being provided with prescription for iron supplement, please take it as directed. I also talked with Dr. Amalia Hailey from gynecology and he will be happy to see you on Monday.  You are being provided with their number please call them on Monday morning and ask for [facility] and told them that Dr. Amalia Hailey told that he will accommodate you on Monday schedule. We checked your A1c as your blood glucose was little high.  It came back 6.7 which makes you in diabetic range.  You also has an history of gestational diabetes.  Please watch your diet and discussed with your primary care provider to start you on a treatment.   Increase activity slowly   Complete by: As directed      Allergies as of 01/27/2020      Reactions   Penicillins Anaphylaxis  Has patient had a PCN reaction causing immediate rash, facial/tongue/throat swelling, SOB or lightheadedness with hypotension: Yes Has patient had a PCN reaction causing severe rash involving mucus membranes or skin necrosis: No Has patient had a PCN reaction that required hospitalization No Has patient had a PCN reaction occurring within the last 10 years: No If all of the above answers are "NO", then may proceed with Cephalosporin use.      Medication List    TAKE these  medications   albuterol 108 (90 Base) MCG/ACT inhaler Commonly known as: VENTOLIN HFA Inhale 2 puffs into the lungs every 6 (six) hours as needed for wheezing or shortness of breath.   BC HEADACHE POWDER PO Take 1 Dose by mouth daily.   ferrous sulfate 325 (65 FE) MG EC tablet Take 1 tablet (325 mg total) by mouth 2 (two) times daily.       Follow-up Information    Harlin Heys, MD. Schedule an appointment as soon as possible for a visit.   Specialties: Obstetrics and Gynecology, Radiology Why: Please call on Monday morning and ask for [facility], and told him that Dr. Amalia Hailey will accommodate her today. Contact information: [place] [address] [phone]              Allergies  Allergen Reactions  . Penicillins Anaphylaxis    Has patient had a PCN reaction causing immediate rash, facial/tongue/throat swelling, SOB or lightheadedness with hypotension: Yes Has patient had a PCN reaction causing severe rash involving mucus membranes or skin necrosis: No Has patient had a PCN reaction that required hospitalization No Has patient had a PCN reaction occurring within the last 10 years: No If all of the above answers are "NO", then may proceed with Cephalosporin use.     Consultations:  None  Procedures/Studies: US PELVIC COMPLETE WITH TRANSVAGINAL  Result Date: 01/26/2020 CLINICAL DATA:  Abnormal vaginal bleeding. EXAM: TRANSABDOMINAL AND TRANSVAGINAL ULTRASOUND OF PELVIS TECHNIQUE: Both transabdominal and transvaginal ultrasound examinations of the pelvis were performed. Transabdominal technique was performed for global imaging of the pelvis including uterus, ovaries, adnexal regions, and pelvic cul-de-sac. It was necessary to proceed with endovaginal exam following the transabdominal exam to visualize the endometrium and adnexa. COMPARISON:  Pelvic ultrasound 05/13/2017 FINDINGS: Uterus Measurements: 8.1 x 5.0 x 4.3 cm = volume: 90 mL.  Hypoechoic subserosal fibroid in the anterior fundus measuring 2.7 x 2.5 x 2.4 cm, similar appearance to prior exam. Endometrium Thickness: 5 mm, normal.  No focal abnormality visualized. Right ovary Measurements: 2.4 x 1.6 x 2.6 cm = volume: 5 mL. Normal appearance with blood flow. No ovarian or adnexal mass. Left ovary Measurements: 2.3 x 1.6 x 1.8 cm = volume: 3 mL. Normal appearance with blood flow. No ovarian or adnexal mass. Other findings No abnormal free fluid. IMPRESSION: 1. Subserosal uterine fibroid in the anterior fundus measuring 2.7 cm, similar in appearance to prior exam. 2. Normal sonographic appearance of the endometrium and ovaries. If bleeding remains unresponsive to hormonal or medical therapy, sonohysterogram should be considered for focal lesion work-up. (Ref: Radiological Reasoning: Algorithmic Workup of Abnormal Vaginal Bleeding with Endovaginal Sonography and Sonohysterography. AJR 2008; 465:[id number]) Electronically Signed   By: Keith Rake M.D.   On: 01/26/2020 20:56    Subjective: Patient was feeling better when seen today.  We discussed about the importance of seeing a gynecologist to take care of her fibroids as it is causing this dysfunctional uterine bleeding.  Patient agreed with  that.  She was receiving her third unit of PRBC.  Discharge Exam: Vitals:   01/27/20 1449 01/27/20 1525  BP: (!) 110/53 (!) 99/59  Pulse: 89 93  Resp: 20   Temp: 99 F (37.2 C) 98.7 F (37.1 C)  SpO2: 98% 100%   Vitals:   01/27/20 1135 01/27/20 1422 01/27/20 1449 01/27/20 1525  BP: (!) 92/58 124/61 (!) 110/53 (!) 99/59  Pulse: 92 88 89 93  Resp: 19 19 20    Temp: 98.9 F (37.2 C) 99 F (37.2 C) 99 F (37.2 C) 98.7 F (37.1 C)  TempSrc: Oral Oral Oral Oral  SpO2:  99% 98% 100%  Weight:      Height:        General: Pt is alert, awake, not in acute distress Cardiovascular: RRR, S1/S2 +, no rubs, no gallops Respiratory: CTA bilaterally, no wheezing, no rhonchi Abdominal:  Soft, NT, ND, bowel sounds + Extremities: no edema, no cyanosis   The results of significant diagnostics from this hospitalization (including imaging, microbiology, ancillary and laboratory) are listed below for reference.    Microbiology: Recent Results (from the past 240 hour(s))  Respiratory Panel by RT PCR (Flu A&B, Covid) - Nasopharyngeal Swab     Status: None   Collection Time: 01/26/20  7:50 PM   Specimen: Nasopharyngeal Swab  Result Value Ref Range Status   SARS Coronavirus 2 by RT PCR NEGATIVE NEGATIVE Final    Comment: (NOTE) SARS-CoV-2 target nucleic acids are NOT DETECTED.  The SARS-CoV-2 RNA is generally detectable in upper respiratoy specimens during the acute phase of infection. The lowest concentration of SARS-CoV-2 viral copies this assay can detect is 131 copies/mL. A negative result does not preclude SARS-Cov-2 infection and should not be used as the sole basis for treatment or other patient management decisions. A negative result may occur with  improper specimen collection/handling, submission of specimen other than nasopharyngeal swab, presence of viral mutation(s) within the areas targeted by this assay, and inadequate number of viral copies (<131 copies/mL). A negative result must be combined with clinical observations, patient history, and epidemiological information. The expected result is Negative.  Fact Sheet for Patients:  PinkCheek.be  Fact Sheet for Healthcare Providers:  GravelBags.it  This test is no t yet approved or cleared by the [place] FDA and  has been authorized for detection and/or diagnosis of SARS-CoV-2 by FDA under an Emergency Use Authorization (EUA). This EUA will remain  in effect (meaning this test can be used) for the duration of the COVID-19 declaration under Section 564(b)(1) of the Act, 21 U.S.C. section 360bbb-3(b)(1), unless the authorization is terminated  or revoked sooner.     Influenza A by PCR NEGATIVE NEGATIVE Final   Influenza B by PCR NEGATIVE NEGATIVE Final    Comment: (NOTE) The Xpert Xpress SARS-CoV-2/FLU/RSV assay is intended as an aid in  the diagnosis of influenza from Nasopharyngeal swab specimens and  should not be used as a sole basis for treatment. Nasal washings and  aspirates are unacceptable for Xpert Xpress SARS-CoV-2/FLU/RSV  testing.  Fact Sheet for Patients: PinkCheek.be  Fact Sheet for Healthcare Providers: GravelBags.it  This test is not yet approved or cleared by the [place] FDA and  has been authorized for detection and/or diagnosis of SARS-CoV-2 by  FDA under an Emergency Use Authorization (EUA). This EUA will remain  in effect (meaning this test can be used) for the duration of the  Covid-19 declaration under Section 564(b)(1) of the Act, 21  U.S.C. section 360bbb-3(b)(1), unless the authorization is  terminated or revoked. Performed at [facility], [address] [place], [place] [address]      Labs: BNP (last 3 results) No results for input(s): BNP in the last 8760 hours. Basic Metabolic Panel: Recent Labs  Lab 01/26/20 1833 01/26/20 2322 01/27/20 0449  NA 134*  --  137  K 3.6  --  3.7  CL 98  --  100  CO2 25  --  27  GLUCOSE 170*  --  138*  BUN 7  --  5*  CREATININE 0.54  --  0.55  CALCIUM 8.4*  --  8.1*  MG  --  1.8  --    Liver Function Tests: Recent Labs  Lab 01/26/20 1833  AST 17  ALT 13  ALKPHOS 72  BILITOT 0.7  PROT 7.6  ALBUMIN 3.5   Recent Labs  Lab 01/26/20 1833  LIPASE 24   No results for input(s): AMMONIA in the last 168 hours. CBC: Recent Labs  Lab 01/26/20 1833 01/27/20 0449  WBC 6.3 7.1  NEUTROABS 4.6  --   HGB 4.1* 5.9*  HCT 17.0* 20.5*  MCV 67.7* 70.0*  PLT 362 290   Cardiac Enzymes: No results for input(s): CKTOTAL, CKMB, CKMBINDEX, TROPONINI in the last 168  hours. BNP: Invalid input(s): POCBNP CBG: No results for input(s): GLUCAP in the last 168 hours. D-Dimer No results for input(s): DDIMER in the last 72 hours. Hgb A1c Recent Labs    01/27/20 0449  HGBA1C 6.7*   Lipid Profile No results for input(s): CHOL, HDL, LDLCALC, TRIG, CHOLHDL, LDLDIRECT in the last 72 hours. Thyroid function studies Recent Labs    01/27/20 0449  TSH 1.026   Anemia work up Recent Labs    01/26/20 2322  TIBC 623*  IRON 48   Urinalysis    Component Value Date/Time   COLORURINE YELLOW (A) 01/26/2020 1836   APPEARANCEUR HAZY (A) 01/26/2020 1836   LABSPEC 1.014 01/26/2020 1836   PHURINE 6.0 01/26/2020 1836   GLUCOSEU NEGATIVE 01/26/2020 1836   HGBUR NEGATIVE 01/26/2020 1836   BILIRUBINUR NEGATIVE 01/26/2020 1836   KETONESUR NEGATIVE 01/26/2020 1836   PROTEINUR NEGATIVE 01/26/2020 1836   UROBILINOGEN 2.0 (H) 06/29/2011 0919   NITRITE NEGATIVE 01/26/2020 1836   LEUKOCYTESUR NEGATIVE 01/26/2020 1836   Sepsis Labs Invalid input(s): PROCALCITONIN,  WBC,  LACTICIDVEN Microbiology Recent Results (from the past 240 hour(s))  Respiratory Panel by RT PCR (Flu A&B, Covid) - Nasopharyngeal Swab     Status: None   Collection Time: 01/26/20  7:50 PM   Specimen: Nasopharyngeal Swab  Result Value Ref Range Status   SARS Coronavirus 2 by RT PCR NEGATIVE NEGATIVE Final    Comment: (NOTE) SARS-CoV-2 target nucleic acids are NOT DETECTED.  The SARS-CoV-2 RNA is generally detectable in upper respiratoy specimens during the acute phase of infection. The lowest concentration of SARS-CoV-2 viral copies this assay can detect is 131 copies/mL. A negative result does not preclude SARS-Cov-2 infection and should not be used as the sole basis for treatment or other patient management decisions. A negative result may occur with  improper specimen collection/handling, submission of specimen other than nasopharyngeal swab, presence of viral mutation(s) within  the areas targeted by this assay, and inadequate number of viral copies (<131 copies/mL). A negative result must be combined with clinical observations, patient history, and epidemiological information. The expected result is Negative.  Fact Sheet for Patients:  PinkCheek.be  Fact Sheet for Healthcare  Providers:  GravelBags.it  This test is no t yet approved or cleared by the [place] and  has been authorized for detection and/or diagnosis of SARS-CoV-2 by FDA under an Emergency Use Authorization (EUA). This EUA will remain  in effect (meaning this test can be used) for the duration of the COVID-19 declaration under Section 564(b)(1) of the Act, 21 U.S.C. section 360bbb-3(b)(1), unless the authorization is terminated or revoked sooner.     Influenza A by PCR NEGATIVE NEGATIVE Final   Influenza B by PCR NEGATIVE NEGATIVE Final    Comment: (NOTE) The Xpert Xpress SARS-CoV-2/FLU/RSV assay is intended as an aid in  the diagnosis of influenza from Nasopharyngeal swab specimens and  should not be used as a sole basis for treatment. Nasal washings and  aspirates are unacceptable for Xpert Xpress SARS-CoV-2/FLU/RSV  testing.  Fact Sheet for Patients: PinkCheek.be  Fact Sheet for Healthcare Providers: GravelBags.it  This test is not yet approved or cleared by the [place] FDA and  has been authorized for detection and/or diagnosis of SARS-CoV-2 by  FDA under an Emergency Use Authorization (EUA). This EUA will remain  in effect (meaning this test can be used) for the duration of the  Covid-19 declaration under Section 564(b)(1) of the Act, 21  U.S.C. section 360bbb-3(b)(1), unless the authorization is  terminated or revoked. Performed at [facility], [address] [place], [place] [address]     Time coordinating discharge: Over 30  minutes  SIGNED:  Lorella Nimrod, MD  [facility] 01/27/2020, 5:16 PM  If 7PM-7AM, please contact night-coverage [email or web address]  This record has been created using Systems analyst. Errors have been sought and corrected,but may not always be located. Such creation errors do not reflect on the standard of care.

## 2020-01-27 NOTE — ED Notes (Signed)
Pt ambulated to restroom at this time to urinate

## 2020-01-27 NOTE — Progress Notes (Signed)
Patient made aware discharge orders are in and she will arrange transportation to leave tomorrow morning.

## 2020-01-27 NOTE — ED Notes (Signed)
Pt placed on 2L at this time, sats dropping while asleep.

## 2020-01-28 LAB — BPAM RBC
Blood Product Expiration Date: 202110122359
Blood Product Expiration Date: 202110192359
Blood Product Expiration Date: 202110222359
Blood Product Expiration Date: 202110222359
ISSUE DATE / TIME: 202109232110
ISSUE DATE / TIME: 202109232248
ISSUE DATE / TIME: 202109241108
ISSUE DATE / TIME: 202109241428
Unit Type and Rh: 6200
Unit Type and Rh: 6200
Unit Type and Rh: 6200
Unit Type and Rh: 6200

## 2020-01-28 LAB — TYPE AND SCREEN
ABO/RH(D): A POS
Antibody Screen: NEGATIVE
Unit division: 0
Unit division: 0
Unit division: 0
Unit division: 0

## 2020-01-28 LAB — HEMOGLOBIN AND HEMATOCRIT, BLOOD
HCT: 24.5 % — ABNORMAL LOW (ref 36.0–46.0)
Hemoglobin: 7.2 g/dL — ABNORMAL LOW (ref 12.0–15.0)

## 2020-01-28 NOTE — Progress Notes (Signed)
Patient voided 350 mL at 1350. Vitals signs WDL. Discharge instructions reviewed with patient. Husband to pick up patient.

## 2020-01-28 NOTE — Progress Notes (Signed)
Patient states unable to void when trying to urinate. Feels urgency. NT [name] to bladder scan for possible retention. Will notify MD if retention noted.

## 2020-01-28 NOTE — Progress Notes (Signed)
No charge progress note.  Patient for symptomatic anemia secondary to abnormal uterine bleeding with uterine hemorrhoids.  Received 4 unit of PRBCs.  She was discharged yesterday after completing blood transfusion to follow-up with GYN as an outpatient.  Patient did not left secondary to unavailability of her ride.  Patient will be leaving this morning once her ride is here.

## 2020-01-28 NOTE — Progress Notes (Addendum)
Spoke with MD Reesa Chew reference patient's urine retention MD informed patient to drink water and walk around to try and increase urination. If no output, MD informed me to perform in and out cath to assess for urine retention.  MT [name] performed bladder scan with result of 29mL observed.

## 2020-01-31 ENCOUNTER — Encounter: Payer: Self-pay | Admitting: Obstetrics and Gynecology

## 2020-01-31 ENCOUNTER — Ambulatory Visit (INDEPENDENT_AMBULATORY_CARE_PROVIDER_SITE_OTHER): Payer: Self-pay | Admitting: Obstetrics and Gynecology

## 2020-01-31 ENCOUNTER — Other Ambulatory Visit (HOSPITAL_COMMUNITY)
Admission: RE | Admit: 2020-01-31 | Discharge: 2020-01-31 | Disposition: A | Discharge status: Home / Self Care | Payer: Medicaid Other | Source: Ambulatory Visit | Attending: Obstetrics and Gynecology | Admitting: Obstetrics and Gynecology

## 2020-01-31 ENCOUNTER — Other Ambulatory Visit: Payer: Self-pay

## 2020-01-31 VITALS — BP 131/84 | HR 97 | Ht 62.0 in | Wt 272.4 lb

## 2020-01-31 DIAGNOSIS — N938 Other specified abnormal uterine and vaginal bleeding: Secondary | ICD-10-CM | POA: Insufficient documentation

## 2020-01-31 DIAGNOSIS — D649 Anemia, unspecified: Secondary | ICD-10-CM

## 2020-01-31 DIAGNOSIS — N8501 Benign endometrial hyperplasia: Secondary | ICD-10-CM | POA: Insufficient documentation

## 2020-01-31 DIAGNOSIS — D252 Subserosal leiomyoma of uterus: Secondary | ICD-10-CM | POA: Insufficient documentation

## 2020-01-31 DIAGNOSIS — Z6841 Body Mass Index (BMI) 40.0 and over, adult: Secondary | ICD-10-CM

## 2020-01-31 NOTE — Addendum Note (Signed)
Addended by: Durwin Glaze on: 01/31/2020 03:10 PM   Modules accepted: Orders

## 2020-01-31 NOTE — Progress Notes (Signed)
HPI:      [name] is a 44 y.o. No obstetric history on file. who LMP was Patient's last menstrual period was 12/21/2019.  Subjective:   She presents today after being seen in the emergency department for severe blood loss anemia.  She received 4 units of packed red cells.  Her hemoglobin was 4 prior to the packed cells.  She has been taking oral iron since that time.  She has now stopped bleeding.  She reports that she was diagnosed with uterine fibroids 2 years ago for heavy bleeding and she has been having heavy irregular bleeding since that time.  She reports no regular cycles except to say that she bleeds approximately 20 out of 30 days/month. Uterine fibroid fundal subserosal was noted on ultrasound in the emergency department. She is currently not sexually active. She does not smoke cigarettes.    Hx: The following portions of the patient's history were reviewed and updated as appropriate:             She  has a past medical history of Anemia, Arthritis, and Hypertension. She does not have any pertinent problems on file. She  has a past surgical history that includes Knee surgery and Wisdom tooth extraction. Her family history is not on file. She  reports that she has been smoking cigarettes. She has been smoking about 1.00 pack per day. She has never used smokeless tobacco. She reports current alcohol use. She reports that she does not use drugs. She has a current medication list which includes the following prescription(s): ferrous sulfate. She is allergic to penicillins.       Review of Systems:  Review of Systems  Constitutional: Denied constitutional symptoms, night sweats, recent illness, fatigue, fever, insomnia and weight loss.  Eyes: Denied eye symptoms, eye pain, photophobia, vision change and visual disturbance.  Ears/Nose/Throat/Neck: Denied ear, nose, throat or neck symptoms, hearing loss, nasal discharge, sinus congestion and sore throat.  Cardiovascular:  Denied cardiovascular symptoms, arrhythmia, chest pain/pressure, edema, exercise intolerance, orthopnea and palpitations.  Respiratory: Denied pulmonary symptoms, asthma, pleuritic pain, productive sputum, cough, dyspnea and wheezing.  Gastrointestinal: Denied, gastro-esophageal reflux, melena, nausea and vomiting.  Genitourinary: See HPI for additional information.  Musculoskeletal: Denied musculoskeletal symptoms, stiffness, swelling, muscle weakness and myalgia.  Dermatologic: Denied dermatology symptoms, rash and scar.  Neurologic: Denied neurology symptoms, dizziness, headache, neck pain and syncope.  Psychiatric: Denied psychiatric symptoms, anxiety and depression.  Endocrine: Denied endocrine symptoms including hot flashes and night sweats.   Meds:   Current Outpatient Medications on File Prior to Visit  Medication Sig Dispense Refill  . ferrous sulfate 325 (65 FE) MG EC tablet Take 1 tablet (325 mg total) by mouth 2 (two) times daily. 60 tablet 3   No current facility-administered medications on file prior to visit.    Objective:     Vitals:   01/31/20 1431  BP: 131/84  Pulse: 97              Physical examination   Pelvic:   Vulva: Normal appearance.  No lesions.  Vagina: No lesions or abnormalities noted.  Support: Normal pelvic support.  Urethra No masses tenderness or scarring.  Meatus Normal size without lesions or prolapse.  Cervix: Normal appearance.  No lesions.  Anus: Normal exam.  No lesions.  Perineum: Normal exam.  No lesions.        Bimanual   Uterus:  Irregular and enlarged non-tender.  Mobile.  AV.  Adnexae: No  masses.  Non-tender to palpation.  Cul-de-sac: Negative for abnormality.     Assessment:    No obstetric history on file. Patient Active Problem List   Diagnosis Date Noted  . Acute anemia 01/26/2020  . Hyperglycemia 01/26/2020  . BMI 50.0-59.9, adult ([place]) 01/26/2020  . Anemia 11/15/2015     1. Dysfunctional uterine bleeding   2.  Acute anemia   3. BMI 50.0-59.9, adult ([place])   4. Fibroids, subserous     Bleeding likely secondary to uterine fibroids although endometrial abnormality not ruled out especially since the patient describes irregular cycles her entire life.  Currently not bleeding.  Likely still mildly anemic but patient coping well.   Plan:            1.  Endometrial biopsy performed.  Will contact patient with results and decide management that time.  2.  Endometrial hyperplasia discussed.  Uterine fibroids discussed.  Endometrial ablation, hysterectomy, fibroid embolization all discussed.  Use of IUD to control bleeding discussed and recommended if no significant endometrial abnormality.  Orders No orders of the defined types were placed in this encounter.   No orders of the defined types were placed in this encounter.     F/U  Return for We will contact her with any abnormal test results. I spent 35 minutes involved in the care of this patient preparing to see the patient by obtaining and reviewing her medical history (including labs, imaging tests and prior procedures), documenting clinical information in the electronic health record (EHR), counseling and coordinating care plans, writing and sending prescriptions, ordering tests or procedures and directly communicating with the patient by discussing pertinent items from her history and physical exam as well as detailing my assessment and plan as noted above so that she has an informed understanding.  All of her questions were answered.  Finis Bud, M.D. 01/31/2020 3:01 PM

## 2020-02-02 LAB — SURGICAL PATHOLOGY

## 2020-02-08 ENCOUNTER — Encounter: Payer: Self-pay | Admitting: Obstetrics and Gynecology

## 2020-02-08 ENCOUNTER — Other Ambulatory Visit: Payer: Self-pay

## 2020-02-08 ENCOUNTER — Ambulatory Visit (INDEPENDENT_AMBULATORY_CARE_PROVIDER_SITE_OTHER): Payer: Self-pay | Admitting: Obstetrics and Gynecology

## 2020-02-08 VITALS — BP 145/83 | HR 102 | Ht 62.0 in | Wt 277.1 lb

## 2020-02-08 DIAGNOSIS — N8500 Endometrial hyperplasia, unspecified: Secondary | ICD-10-CM

## 2020-02-08 NOTE — Progress Notes (Signed)
HPI:      Lydia Hancock is a 44 y.o. No obstetric history on file. who LMP was No LMP recorded.  Subjective:   She presents today to discuss her irregular heavy vaginal bleeding.  She underwent endometrial biopsy revealing simple and complex endometrial hyperplasia without atypia.  She is currently not having bleeding.    Hx: The following portions of the patient's history were reviewed and updated as appropriate:             She  has a past medical history of Anemia, Arthritis, and Hypertension. She does not have any pertinent problems on file. She  has a past surgical history that includes Knee surgery and Wisdom tooth extraction. Her family history is not on file. She  reports that she has been smoking cigarettes. She has been smoking about 1.00 pack per day. She has never used smokeless tobacco. She reports current alcohol use. She reports that she does not use drugs. She has a current medication list which includes the following prescription(s): ferrous sulfate. She is allergic to penicillins.       Review of Systems:  Review of Systems  Constitutional: Denied constitutional symptoms, night sweats, recent illness, fatigue, fever, insomnia and weight loss.  Eyes: Denied eye symptoms, eye pain, photophobia, vision change and visual disturbance.  Ears/Nose/Throat/Neck: Denied ear, nose, throat or neck symptoms, hearing loss, nasal discharge, sinus congestion and sore throat.  Cardiovascular: Denied cardiovascular symptoms, arrhythmia, chest pain/pressure, edema, exercise intolerance, orthopnea and palpitations.  Respiratory: Denied pulmonary symptoms, asthma, pleuritic pain, productive sputum, cough, dyspnea and wheezing.  Gastrointestinal: Denied, gastro-esophageal reflux, melena, nausea and vomiting.  Genitourinary: Denied genitourinary symptoms including symptomatic vaginal discharge, pelvic relaxation issues, and urinary complaints.  Musculoskeletal: Denied musculoskeletal  symptoms, stiffness, swelling, muscle weakness and myalgia.  Dermatologic: Denied dermatology symptoms, rash and scar.  Neurologic: Denied neurology symptoms, dizziness, headache, neck pain and syncope.  Psychiatric: Denied psychiatric symptoms, anxiety and depression.  Endocrine: Denied endocrine symptoms including hot flashes and night sweats.   Meds:   Current Outpatient Medications on File Prior to Visit  Medication Sig Dispense Refill  . ferrous sulfate 325 (65 FE) MG EC tablet Take 1 tablet (325 mg total) by mouth 2 (two) times daily. 60 tablet 3   No current facility-administered medications on file prior to visit.       Objective:     Vitals:   02/08/20 1026  BP: (!) 145/83  Pulse: (!) 102   Filed Weights   02/08/20 1026  Weight: 277 lb 1.6 oz (125.7 kg)              Pathology report showing simple and complex endometrial hyperplasia without atypia  Assessment:    No obstetric history on file. Patient Active Problem List   Diagnosis Date Noted  . Acute anemia 01/26/2020  . Hyperglycemia 01/26/2020  . BMI 50.0-59.9, adult ([place]) 01/26/2020  . Anemia 11/15/2015     1. Endometrial hyperplasia without atypia        Plan:            1.  Discussed endometrial hyperplasia in detail.  Recommend progestin therapy.  Discussed Megace versus levonorgestrel IUD.  Patient has chosen IUD will insert within the next 2 weeks.  Plan follow-up endometrial biopsy approximately 4 months.  All questions answered. Orders No orders of the defined types were placed in this encounter.   No orders of the defined types were placed in this encounter.  F/U  Return in about 2 weeks (around 02/22/2020). I spent 21 minutes involved in the care of this patient preparing to see the patient by obtaining and reviewing her medical history (including labs, imaging tests and prior procedures), documenting clinical information in the electronic health record (EHR), counseling and  coordinating care plans, writing and sending prescriptions, ordering tests or procedures and directly communicating with the patient by discussing pertinent items from her history and physical exam as well as detailing my assessment and plan as noted above so that she has an informed understanding.  All of her questions were answered.  Finis Bud, M.D. 02/08/2020 11:00 AM

## 2020-02-15 ENCOUNTER — Ambulatory Visit (INDEPENDENT_AMBULATORY_CARE_PROVIDER_SITE_OTHER): Payer: Medicaid Other | Admitting: Obstetrics and Gynecology

## 2020-02-15 ENCOUNTER — Other Ambulatory Visit: Payer: Self-pay

## 2020-02-15 ENCOUNTER — Encounter: Payer: Self-pay | Admitting: Obstetrics and Gynecology

## 2020-02-15 VITALS — BP 123/73 | HR 106 | Ht 62.0 in | Wt 281.5 lb

## 2020-02-15 DIAGNOSIS — Z30430 Encounter for insertion of intrauterine contraceptive device: Secondary | ICD-10-CM | POA: Diagnosis not present

## 2020-02-15 NOTE — Progress Notes (Signed)
HPI:      [name] is a 44 y.o. No obstetric history on file. who LMP was Patient's last menstrual period was 02/13/2020.  Subjective:   She presents today for IUD insertion.  She has decided on progesterone containing IUD for treatment of endometrial hyperplasia.    Hx: The following portions of the patient's history were reviewed and updated as appropriate:             She  has a past medical history of Anemia, Arthritis, and Hypertension. She does not have any pertinent problems on file. She  has a past surgical history that includes Knee surgery and Wisdom tooth extraction. Her family history is not on file. She  reports that she has been smoking cigarettes. She has been smoking about 1.00 pack per day. She has never used smokeless tobacco. She reports current alcohol use. She reports that she does not use drugs. She has a current medication list which includes the following prescription(s): ferrous sulfate. She is allergic to penicillins.       Review of Systems:  Review of Systems  Constitutional: Denied constitutional symptoms, night sweats, recent illness, fatigue, fever, insomnia and weight loss.  Eyes: Denied eye symptoms, eye pain, photophobia, vision change and visual disturbance.  Ears/Nose/Throat/Neck: Denied ear, nose, throat or neck symptoms, hearing loss, nasal discharge, sinus congestion and sore throat.  Cardiovascular: Denied cardiovascular symptoms, arrhythmia, chest pain/pressure, edema, exercise intolerance, orthopnea and palpitations.  Respiratory: Denied pulmonary symptoms, asthma, pleuritic pain, productive sputum, cough, dyspnea and wheezing.  Gastrointestinal: Denied, gastro-esophageal reflux, melena, nausea and vomiting.  Genitourinary: Denied genitourinary symptoms including symptomatic vaginal discharge, pelvic relaxation issues, and urinary complaints.  Musculoskeletal: Denied musculoskeletal symptoms, stiffness, swelling, muscle weakness and  myalgia.  Dermatologic: Denied dermatology symptoms, rash and scar.  Neurologic: Denied neurology symptoms, dizziness, headache, neck pain and syncope.  Psychiatric: Denied psychiatric symptoms, anxiety and depression.  Endocrine: Denied endocrine symptoms including hot flashes and night sweats.   Meds:   Current Outpatient Medications on File Prior to Visit  Medication Sig Dispense Refill  . ferrous sulfate 325 (65 FE) MG EC tablet Take 1 tablet (325 mg total) by mouth 2 (two) times daily. 60 tablet 3   No current facility-administered medications on file prior to visit.    Objective:     Vitals:   02/15/20 1404  BP: 123/73  Pulse: (!) 106    Physical examination   Pelvic:   Vulva: Normal appearance.  No lesions.  Vagina: No lesions or abnormalities noted.  Support: Normal pelvic support.  Urethra No masses tenderness or scarring.  Meatus Normal size without lesions or prolapse.  Cervix: Normal appearance.  No lesions.  Anus: Normal exam.  No lesions.  Perineum: Normal exam.  No lesions.        Bimanual   Uterus: Normal size.  Non-tender.  Mobile.  AV.  Adnexae: No masses.  Non-tender to palpation.  Cul-de-sac: Negative for abnormality.   IUD Procedure Pt has read the booklet and signed the appropriate forms regarding the Mirena IUD.  All of her questions have been answered.   The cervix was cleansed with betadine solution.  After sounding the uterus and noting the position, the IUD was placed in the usual manner without problem.  The string was cut to the appropriate length.  The patient tolerated the procedure well.              Assessment:    No obstetric history on  file. Patient Active Problem List   Diagnosis Date Noted  . Acute anemia 01/26/2020  . Hyperglycemia 01/26/2020  . BMI 50.0-59.9, adult ([place]) 01/26/2020  . Anemia 11/15/2015     1. Encounter for insertion of mirena IUD     Placed for endometrial hyperplasia treatment  Plan:              F/U  Return in about 4 weeks (around 03/14/2020) for For IUD f/u.  Finis Bud, M.D. 02/15/2020 2:25 PM

## 2020-03-14 ENCOUNTER — Ambulatory Visit (INDEPENDENT_AMBULATORY_CARE_PROVIDER_SITE_OTHER): Payer: Medicaid Other | Admitting: Obstetrics and Gynecology

## 2020-03-14 ENCOUNTER — Encounter: Payer: Self-pay | Admitting: Obstetrics and Gynecology

## 2020-03-14 ENCOUNTER — Other Ambulatory Visit: Payer: Self-pay

## 2020-03-14 VITALS — BP 99/68 | HR 105 | Ht 62.0 in | Wt 269.9 lb

## 2020-03-14 DIAGNOSIS — Z30431 Encounter for routine checking of intrauterine contraceptive device: Secondary | ICD-10-CM

## 2020-03-14 DIAGNOSIS — N8500 Endometrial hyperplasia, unspecified: Secondary | ICD-10-CM

## 2020-03-14 NOTE — Progress Notes (Signed)
HPI:      [name] is a 44 y.o. No obstetric history on file. who LMP was Patient's last menstrual period was 02/13/2020.  Subjective:   She presents today for follow-up IUD placement.  (IUD was placed for endometrial hyperplasia without atypia)  She reports that she had some heavier bleeding in the first week but now it is still present but light.  She reports no other problems from the IUD.    Hx: The following portions of the patient's history were reviewed and updated as appropriate:             She  has a past medical history of Anemia, Arthritis, and Hypertension. She does not have any pertinent problems on file. She  has a past surgical history that includes Knee surgery and Wisdom tooth extraction. Her family history is not on file. She  reports that she has been smoking cigarettes. She has been smoking about 1.00 pack per day. She has never used smokeless tobacco. She reports current alcohol use. She reports that she does not use drugs. She has a current medication list which includes the following prescription(s): ferrous sulfate. She is allergic to penicillins.       Review of Systems:  Review of Systems  Constitutional: Denied constitutional symptoms, night sweats, recent illness, fatigue, fever, insomnia and weight loss.  Eyes: Denied eye symptoms, eye pain, photophobia, vision change and visual disturbance.  Ears/Nose/Throat/Neck: Denied ear, nose, throat or neck symptoms, hearing loss, nasal discharge, sinus congestion and sore throat.  Cardiovascular: Denied cardiovascular symptoms, arrhythmia, chest pain/pressure, edema, exercise intolerance, orthopnea and palpitations.  Respiratory: Denied pulmonary symptoms, asthma, pleuritic pain, productive sputum, cough, dyspnea and wheezing.  Gastrointestinal: Denied, gastro-esophageal reflux, melena, nausea and vomiting.  Genitourinary: Denied genitourinary symptoms including symptomatic vaginal discharge, pelvic  relaxation issues, and urinary complaints.  Musculoskeletal: Denied musculoskeletal symptoms, stiffness, swelling, muscle weakness and myalgia.  Dermatologic: Denied dermatology symptoms, rash and scar.  Neurologic: Denied neurology symptoms, dizziness, headache, neck pain and syncope.  Psychiatric: Denied psychiatric symptoms, anxiety and depression.  Endocrine: Denied endocrine symptoms including hot flashes and night sweats.   Meds:   Current Outpatient Medications on File Prior to Visit  Medication Sig Dispense Refill  . ferrous sulfate 325 (65 FE) MG EC tablet Take 1 tablet (325 mg total) by mouth 2 (two) times daily. 60 tablet 3   No current facility-administered medications on file prior to visit.          Objective:     Vitals:   03/14/20 1420  BP: 99/68  Pulse: (!) 105   Filed Weights   03/14/20 1420  Weight: 269 lb 14.4 oz (122.4 kg)              Physical examination   Pelvic:  Vulva: Normal appearance.  No lesions.  Vagina: No lesions or abnormalities noted.  Support: Normal pelvic support.  Urethra No masses tenderness or scarring.  Meatus Normal size without lesions or prolapse.  Cervix: Normal appearance.  No lesions. IUD strings noted at cervical os.  Anus: Normal exam.  No lesions.  Perineum: Normal exam.  No lesions.     Assessment:    No obstetric history on file. Patient Active Problem List   Diagnosis Date Noted  . Acute anemia 01/26/2020  . Hyperglycemia 01/26/2020  . BMI 50.0-59.9, adult ([place]) 01/26/2020  . Anemia 11/15/2015     1. Surveillance of previously prescribed intrauterine contraceptive device   2. Endometrial hyperplasia  without atypia     Patient doing well with IUD   Plan:            1.  Follow-up in 6 months  2.  Consider endometrial biopsy at that time to rule out continued presence of hyperplasia. Orders No orders of the defined types were placed in this encounter.   No orders of the defined types were placed  in this encounter.     F/U  No follow-ups on file. I spent 14 minutes involved in the care of this patient preparing to see the patient by obtaining and reviewing her medical history (including labs, imaging tests and prior procedures), documenting clinical information in the electronic health record (EHR), counseling and coordinating care plans, writing and sending prescriptions, ordering tests or procedures and directly communicating with the patient by discussing pertinent items from her history and physical exam as well as detailing my assessment and plan as noted above so that she has an informed understanding.  All of her questions were answered.  Finis Bud, M.D. 03/14/2020 2:40 PM

## 2020-07-07 ENCOUNTER — Emergency Department
Admission: EM | Admit: 2020-07-07 | Discharge: 2020-07-07 | Disposition: A | Discharge status: Home / Self Care | Payer: Medicaid Other | Attending: Emergency Medicine | Admitting: Emergency Medicine

## 2020-07-07 ENCOUNTER — Other Ambulatory Visit: Payer: Self-pay

## 2020-07-07 DIAGNOSIS — N939 Abnormal uterine and vaginal bleeding, unspecified: Secondary | ICD-10-CM | POA: Insufficient documentation

## 2020-07-07 DIAGNOSIS — F17210 Nicotine dependence, cigarettes, uncomplicated: Secondary | ICD-10-CM | POA: Insufficient documentation

## 2020-07-07 DIAGNOSIS — R531 Weakness: Secondary | ICD-10-CM

## 2020-07-07 DIAGNOSIS — D649 Anemia, unspecified: Secondary | ICD-10-CM

## 2020-07-07 DIAGNOSIS — I10 Essential (primary) hypertension: Secondary | ICD-10-CM | POA: Insufficient documentation

## 2020-07-07 LAB — URINALYSIS, COMPLETE (UACMP) WITH MICROSCOPIC
Bilirubin Urine: NEGATIVE
Glucose, UA: >=500 mg/dL — AB
Ketones, ur: NEGATIVE mg/dL
Leukocytes,Ua: NEGATIVE
Nitrite: NEGATIVE
Protein, ur: NEGATIVE mg/dL
Specific Gravity, Urine: 1.001 — ABNORMAL LOW (ref 1.005–1.030)
pH: 6 (ref 5.0–8.0)

## 2020-07-07 LAB — CBC WITH DIFFERENTIAL/PLATELET
Abs Immature Granulocytes: 0.02 10*3/uL (ref 0.00–0.07)
Basophils Absolute: 0 10*3/uL (ref 0.0–0.1)
Basophils Relative: 0 %
Eosinophils Absolute: 0.1 10*3/uL (ref 0.0–0.5)
Eosinophils Relative: 1 %
HCT: 32.2 % — ABNORMAL LOW (ref 36.0–46.0)
Hemoglobin: 8.6 g/dL — ABNORMAL LOW (ref 12.0–15.0)
Immature Granulocytes: 0 %
Lymphocytes Relative: 22 %
Lymphs Abs: 1.7 10*3/uL (ref 0.7–4.0)
MCH: 19.6 pg — ABNORMAL LOW (ref 26.0–34.0)
MCHC: 26.7 g/dL — ABNORMAL LOW (ref 30.0–36.0)
MCV: 73.5 fL — ABNORMAL LOW (ref 80.0–100.0)
Monocytes Absolute: 0.7 10*3/uL (ref 0.1–1.0)
Monocytes Relative: 9 %
Neutro Abs: 5 10*3/uL (ref 1.7–7.7)
Neutrophils Relative %: 68 %
Platelets: 353 10*3/uL (ref 150–400)
RBC: 4.38 MIL/uL (ref 3.87–5.11)
RDW: 19.9 % — ABNORMAL HIGH (ref 11.5–15.5)
WBC: 7.5 10*3/uL (ref 4.0–10.5)
nRBC: 0 % (ref 0.0–0.2)

## 2020-07-07 LAB — COMPREHENSIVE METABOLIC PANEL
ALT: 18 U/L (ref 0–44)
AST: 23 U/L (ref 15–41)
Albumin: 3.4 g/dL — ABNORMAL LOW (ref 3.5–5.0)
Alkaline Phosphatase: 84 U/L (ref 38–126)
Anion gap: 11 (ref 5–15)
BUN: 6 mg/dL (ref 6–20)
CO2: 25 mmol/L (ref 22–32)
Calcium: 8.6 mg/dL — ABNORMAL LOW (ref 8.9–10.3)
Chloride: 102 mmol/L (ref 98–111)
Creatinine, Ser: 0.58 mg/dL (ref 0.44–1.00)
GFR, Estimated: >60 mL/min (ref >60)
Glucose, Bld: 279 mg/dL — ABNORMAL HIGH (ref 70–99)
Potassium: 3.9 mmol/L (ref 3.5–5.1)
Sodium: 138 mmol/L (ref 135–145)
Total Bilirubin: 0.3 mg/dL (ref 0.3–1.2)
Total Protein: 7.5 g/dL (ref 6.5–8.1)

## 2020-07-07 LAB — POC URINE PREG, ED: Preg Test, Ur: NEGATIVE

## 2020-07-07 MED ORDER — LACTATED RINGERS IV BOLUS
1000.0000 mL | Freq: Once | INTRAVENOUS | Status: AC
  Administered 2020-07-07: 1000 mL via INTRAVENOUS

## 2020-07-07 MED ORDER — FERROUS SULFATE 325 (65 FE) MG PO TBEC: 325.0000 mg | DELAYED_RELEASE_TABLET | Freq: Two times a day (BID) | ORAL | 3 refills | Status: AC

## 2020-07-07 NOTE — ED Triage Notes (Signed)
Pt arrives EMS for c/o weakness and dizziness. Pt has hx of anemia and uterine cancer stage I. Pt also has drank a 40 oz beer tonight and typically drinks one 40 oz beer every night.

## 2020-07-07 NOTE — ED Provider Notes (Signed)
[facility] Emergency Department Provider Note   ____________________________________________   Event Date/Time   First MD Initiated Contact with Patient 07/07/20 0012     (approximate)  I have reviewed the triage vital signs and the nursing notes.   HISTORY  Chief Complaint Weakness    HPI Lydia Hancock is a 45 y.o. female with past medical history of hypertension, anemia, and abnormal uterine bleeding who presents to the ED for dizziness and weakness.  Patient reports that she has been feeling increasingly lightheaded and weak for the past 2 weeks.  She states she feels very sleepy all the time and has a hard time staying awake.  This is been associated with heavy vaginal bleeding, due to which she has to wear adult diapers.  She reports changing these multiple times per day and she also feels like she is having bleeding from her hemorrhoids.  She denies any dysuria, hematuria, abdominal pain, fevers, nausea, or vomiting.  She has dealt with this in the past and has an IUD in place, follows with Dr. Amalia Hailey of OB/GYN.        Past Medical History:  Diagnosis Date  . Anemia   . Arthritis   . Hypertension     Patient Active Problem List   Diagnosis Date Noted  . Acute anemia 01/26/2020  . Hyperglycemia 01/26/2020  . BMI 50.0-59.9, adult ([place]) 01/26/2020  . Anemia 11/15/2015    Past Surgical History:  Procedure Laterality Date  . KNEE SURGERY    . WISDOM TOOTH EXTRACTION      Prior to Admission medications   Medication Sig Start Date End Date Taking? Authorizing Provider  ferrous sulfate 325 (65 FE) MG EC tablet Take 1 tablet (325 mg total) by mouth 2 (two) times daily. 07/07/20 07/07/21 Yes Blake Divine, MD    Allergies Penicillins  No family history on file.  Social History Social History   Tobacco Use  . Smoking status: Current Every Day Smoker    Packs/day: 1.00    Types: Cigarettes  . Smokeless tobacco: Never Used  Vaping  Use  . Vaping Use: Never used  Substance Use Topics  . Alcohol use: Yes    Comment: Daily-bottle of jack daniels   . Drug use: No    Review of Systems  Constitutional: No fever/chills.  Positive for dizziness and lightheadedness. Eyes: No visual changes. ENT: No sore throat. Cardiovascular: Denies chest pain. Respiratory: Denies shortness of breath. Gastrointestinal: No abdominal pain.  No nausea, no vomiting.  No diarrhea.  No constipation. Genitourinary: Negative for dysuria.  Positive for vaginal bleeding. Musculoskeletal: Negative for back pain. Skin: Negative for rash. Neurological: Negative for headaches, focal weakness or numbness.  ____________________________________________   PHYSICAL EXAM:  VITAL SIGNS: ED Triage Vitals  Enc Vitals Group     BP 07/07/20 0008 (!) 151/72     Pulse Rate 07/07/20 0008 (!) 102     Resp 07/07/20 0008 20     Temp 07/07/20 0008 98 F (36.7 C)     Temp Source 07/07/20 0008 Oral     SpO2 07/07/20 0008 98 %     Weight 07/07/20 0012 208 lb (94.3 kg)     Height 07/07/20 0012 5\' 2"  (1.575 m)     Head Circumference --      Peak Flow --      Pain Score 07/07/20 0012 6     Pain Loc --      Pain Edu? --  Excl. in [place]? --     Constitutional: Alert and oriented. Eyes: Conjunctivae are normal. Head: Atraumatic. Nose: No congestion/rhinnorhea. Mouth/Throat: Mucous membranes are moist. Neck: Normal ROM Cardiovascular: Normal rate, regular rhythm. Grossly normal heart sounds. Respiratory: Normal respiratory effort.  No retractions. Lungs CTAB. Gastrointestinal: Soft and nontender. No distention. Genitourinary: deferred Musculoskeletal: No lower extremity tenderness nor edema. Neurologic:  Normal speech and language. No gross focal neurologic deficits are appreciated. Skin:  Skin is warm, dry and intact.  Diffuse rash noted with excoriations. Psychiatric: Mood and affect are normal. Speech and behavior are  normal.  ____________________________________________   LABS (all labs ordered are listed, but only abnormal results are displayed)  Labs Reviewed  URINALYSIS, COMPLETE (UACMP) WITH MICROSCOPIC - Abnormal; Notable for the following components:      Result Value   Color, Urine STRAW (*)    APPearance CLEAR (*)    Specific Gravity, Urine 1.001 (*)    Glucose, UA >=500 (*)    Hgb urine dipstick SMALL (*)    Bacteria, UA RARE (*)    All other components within normal limits  CBC WITH DIFFERENTIAL/PLATELET - Abnormal; Notable for the following components:   Hemoglobin 8.6 (*)    HCT 32.2 (*)    MCV 73.5 (*)    MCH 19.6 (*)    MCHC 26.7 (*)    RDW 19.9 (*)    All other components within normal limits  COMPREHENSIVE METABOLIC PANEL - Abnormal; Notable for the following components:   Glucose, Bld 279 (*)    Calcium 8.6 (*)    Albumin 3.4 (*)    All other components within normal limits  POC URINE PREG, ED   ____________________________________________  EKG  ED ECG REPORT I, Blake Divine, the attending physician, personally viewed and interpreted this ECG.   Date: 07/07/2020  EKG Time: 00:19  Rate: 94  Rhythm: normal sinus rhythm  Axis: Normal  Intervals:none  ST&T Change: None   PROCEDURES  Procedure(s) performed (including Critical Care):  Procedures   ____________________________________________   INITIAL IMPRESSION / ASSESSMENT AND PLAN / ED COURSE       45 year old female with past medical history of hypertension, anemia, and abnormal uterine bleeding who presents to the ED complaining of heavy vaginal bleeding for the past [redacted] weeks along with increasing lightheadedness and dizziness with fatigue.  Patient is concerned that her blood counts are again low.  She remains hemodynamically stable at this time, we will check H&H and also screen EKG and electrolytes.  No symptoms to suggest infectious process at this time.  Labs are reassuring, hemoglobin is 8.6,  which is an improvement from her previous.  I doubt her lightheadedness is due to anemia, however she is slightly hyperglycemic and may be dehydrated.  She was given IV fluids and reports feeling better, remainder of labs are unremarkable.  She is no longer taking iron and we will refill this for her.  She is appropriate for discharge home with OB/GYN and PCP follow-up, was counseled to return to the ED for new worsening symptoms.  Patient agrees with plan.      ____________________________________________   FINAL CLINICAL IMPRESSION(S) / ED DIAGNOSES  Final diagnoses:  Generalized weakness  Anemia, unspecified type     ED Discharge Orders         Ordered    ferrous sulfate 325 (65 FE) MG EC tablet  2 times daily        07/07/20 0217  Note:  This document was prepared using Dragon voice recognition software and may include unintentional dictation errors.   Blake Divine, MD 07/07/20 [phone]

## 2020-07-07 NOTE — ED Notes (Signed)
E-signature for discharge not working in room, pt signed paper copy and placed to be scanned into chart. Pt verbalized understanding of instructions.

## 2020-09-11 ENCOUNTER — Encounter: Payer: Self-pay | Admitting: Obstetrics and Gynecology

## 2020-09-11 ENCOUNTER — Ambulatory Visit (INDEPENDENT_AMBULATORY_CARE_PROVIDER_SITE_OTHER): Payer: Self-pay | Admitting: Obstetrics and Gynecology

## 2020-09-11 ENCOUNTER — Other Ambulatory Visit: Payer: Self-pay

## 2020-09-11 VITALS — BP 164/74 | HR 102 | Ht 62.0 in | Wt 275.0 lb

## 2020-09-11 DIAGNOSIS — N8500 Endometrial hyperplasia, unspecified: Secondary | ICD-10-CM

## 2020-09-11 DIAGNOSIS — N61 Mastitis without abscess: Secondary | ICD-10-CM

## 2020-09-11 MED ORDER — CLINDAMYCIN HCL 300 MG PO CAPS: 300.0000 mg | ORAL_CAPSULE | Freq: Three times a day (TID) | ORAL | 0 refills | Status: AC

## 2020-09-11 NOTE — Progress Notes (Signed)
HPI:      Lydia Hancock is a 45 y.o. No obstetric history on file. who LMP was No LMP recorded. (Menstrual status: IUD).  Subjective:   She presents today for follow-up of IUD placement for hyperplasia of the endometrial cavity (simple).  She states that her period has stopped and she has no bleeding with her IUD. She does complain of a breast cyst of her left breast that is painful to touch.  She states has been present for 2 days.  Denies nipple discharge.  Reports that she has never had a mammogram.    Hx: The following portions of the patient's history were reviewed and updated as appropriate:             She  has a past medical history of Anemia, Arthritis, and Hypertension. She does not have any pertinent problems on file. She  has a past surgical history that includes Knee surgery and Wisdom tooth extraction. Her family history includes COPD in her mother; Cancer in her father, maternal grandfather, maternal grandmother, paternal grandfather, and paternal grandmother. She  reports that she has been smoking cigarettes. She has been smoking about 1.00 pack per day. She has never used smokeless tobacco. She reports current alcohol use. She reports that she does not use drugs. She has a current medication list which includes the following prescription(s): clindamycin and ferrous sulfate. She is allergic to penicillins.       Review of Systems:  Review of Systems  Constitutional: Denied constitutional symptoms, night sweats, recent illness, fatigue, fever, insomnia and weight loss.  Eyes: Denied eye symptoms, eye pain, photophobia, vision change and visual disturbance.  Ears/Nose/Throat/Neck: Denied ear, nose, throat or neck symptoms, hearing loss, nasal discharge, sinus congestion and sore throat.  Cardiovascular: Denied cardiovascular symptoms, arrhythmia, chest pain/pressure, edema, exercise intolerance, orthopnea and palpitations.  Respiratory: Denied pulmonary symptoms,  asthma, pleuritic pain, productive sputum, cough, dyspnea and wheezing.  Gastrointestinal: Denied, gastro-esophageal reflux, melena, nausea and vomiting.  Genitourinary: See HPI for additional information.  Musculoskeletal: Denied musculoskeletal symptoms, stiffness, swelling, muscle weakness and myalgia.  Dermatologic: Denied dermatology symptoms, rash and scar.  Neurologic: Denied neurology symptoms, dizziness, headache, neck pain and syncope.  Psychiatric: Denied psychiatric symptoms, anxiety and depression.  Endocrine: Denied endocrine symptoms including hot flashes and night sweats.   Meds:   Current Outpatient Medications on File Prior to Visit  Medication Sig Dispense Refill  . ferrous sulfate 325 (65 FE) MG EC tablet Take 1 tablet (325 mg total) by mouth 2 (two) times daily. 60 tablet 3   No current facility-administered medications on file prior to visit.          Objective:     Vitals:   09/11/20 1028  BP: (!) 164/74  Pulse: (!) 102   Filed Weights   09/11/20 1028  Weight: 275 lb (124.7 kg)              Physical examination   Pelvic:   Vulva: Normal appearance.  No lesions.  Vagina: No lesions or abnormalities noted.  Support: Normal pelvic support.  Urethra No masses tenderness or scarring.  Meatus Normal size without lesions or prolapse.  Cervix: Normal appearance.  No lesions.  IUD strings present  Anus: Normal exam.  No lesions.  Perineum: Normal exam.  No lesions.        Bimanual   Uterus: Normal size.  Non-tender.  Mobile.  AV.  Adnexae: No masses.  Non-tender to palpation.  Cul-de-sac: Negative  for abnormality.   Limited by body habitus  Examination of the left breast reveals erythematous tender area approximately 3 cm in diameter.  Left breast lower inner quadrant.  Area not fluctuant.  Appears consistent with cellulitis  Assessment:    No obstetric history on file. Patient Active Problem List   Diagnosis Date Noted  . Acute anemia  01/26/2020  . Hyperglycemia 01/26/2020  . BMI 50.0-59.9, adult ([place]) 01/26/2020  . Anemia 11/15/2015     1. Cellulitis of female breast   2. Endometrial hyperplasia without atypia     Because patient is not having any bleeding with IUD we will continue with expectant management.  If she begins to have any bleeding consider endometrial biopsy.  The concern regarding displacement of the IUD by biopsy has been discussed with the patient but if any bleeding I believe the biopsy would be warranted.   Plan:            1.  Follow-up in 6 months for bleeding and hyperplasia  2.  Patient is penicillin allergic-we will treat breast abscess with clindamycin.  Warm compresses gentle massage discussed.  Patient to follow-up if cellulitis not resolved by the end of the course of antibiotics.   3.  Patient has never had a screening mammogram advised her to obtain a mammogram after resolution of the breast cellulitis.  Orders No orders of the defined types were placed in this encounter.   No orders of the defined types were placed in this encounter.     F/U  No follow-ups on file. I spent 31 minutes involved in the care of this patient preparing to see the patient by obtaining and reviewing her medical history (including labs, imaging tests and prior procedures), documenting clinical information in the electronic health record (EHR), counseling and coordinating care plans, writing and sending prescriptions, ordering tests or procedures and directly communicating with the patient by discussing pertinent items from her history and physical exam as well as detailing my assessment and plan as noted above so that she has an informed understanding.  All of her questions were answered. Finis Bud, M.D. 09/11/2020 10:56 AM

## 2021-03-14 ENCOUNTER — Encounter: Payer: Medicaid Other | Admitting: Obstetrics and Gynecology

## 2021-03-14 ENCOUNTER — Encounter: Payer: Self-pay | Admitting: Obstetrics and Gynecology

## 2021-08-27 ENCOUNTER — Emergency Department: Payer: 59

## 2021-08-27 ENCOUNTER — Other Ambulatory Visit: Payer: Self-pay

## 2021-08-27 ENCOUNTER — Emergency Department
Admission: EM | Admit: 2021-08-27 | Discharge: 2021-08-27 | Disposition: A | Discharge status: Home / Self Care | Payer: 59 | Attending: Student in an Organized Health Care Education/Training Program | Admitting: Student in an Organized Health Care Education/Training Program

## 2021-08-27 DIAGNOSIS — R519 Headache, unspecified: Secondary | ICD-10-CM | POA: Insufficient documentation

## 2021-08-27 DIAGNOSIS — R202 Paresthesia of skin: Secondary | ICD-10-CM | POA: Insufficient documentation

## 2021-08-27 DIAGNOSIS — H538 Other visual disturbances: Secondary | ICD-10-CM | POA: Diagnosis not present

## 2021-08-27 DIAGNOSIS — R7309 Other abnormal glucose: Secondary | ICD-10-CM | POA: Diagnosis not present

## 2021-08-27 LAB — COMPREHENSIVE METABOLIC PANEL
ALT: 14 U/L (ref 0–44)
AST: 15 U/L (ref 15–41)
Albumin: 3.3 g/dL — ABNORMAL LOW (ref 3.5–5.0)
Alkaline Phosphatase: 82 U/L (ref 38–126)
Anion gap: 8 (ref 5–15)
BUN: 12 mg/dL (ref 6–20)
CO2: 28 mmol/L (ref 22–32)
Calcium: 8.7 mg/dL — ABNORMAL LOW (ref 8.9–10.3)
Chloride: 98 mmol/L (ref 98–111)
Creatinine, Ser: 0.54 mg/dL (ref 0.44–1.00)
GFR, Estimated: >60 mL/min (ref >60)
Glucose, Bld: 258 mg/dL — ABNORMAL HIGH (ref 70–99)
Potassium: 4 mmol/L (ref 3.5–5.1)
Sodium: 134 mmol/L — ABNORMAL LOW (ref 135–145)
Total Bilirubin: 0.5 mg/dL (ref 0.3–1.2)
Total Protein: 7.5 g/dL (ref 6.5–8.1)

## 2021-08-27 LAB — CBC
HCT: 43.3 % (ref 36.0–46.0)
Hemoglobin: 14 g/dL (ref 12.0–15.0)
MCH: 28.5 pg (ref 26.0–34.0)
MCHC: 32.3 g/dL (ref 30.0–36.0)
MCV: 88.2 fL (ref 80.0–100.0)
Platelets: 287 10*3/uL (ref 150–400)
RBC: 4.91 MIL/uL (ref 3.87–5.11)
RDW: 12.5 % (ref 11.5–15.5)
WBC: 5.6 10*3/uL (ref 4.0–10.5)
nRBC: 0 % (ref 0.0–0.2)

## 2021-08-27 MED ORDER — ACETAMINOPHEN 325 MG PO TABS
650.0000 mg | ORAL_TABLET | Freq: Once | ORAL | Status: AC
  Administered 2021-08-27: 650 mg via ORAL
  Filled 2021-08-27: qty 2

## 2021-08-27 MED ORDER — PROCHLORPERAZINE EDISYLATE 10 MG/2ML IJ SOLN
10.0000 mg | Freq: Once | INTRAMUSCULAR | Status: AC
  Administered 2021-08-27: 10 mg via INTRAVENOUS
  Filled 2021-08-27: qty 2

## 2021-08-27 MED ORDER — KETOROLAC TROMETHAMINE 30 MG/ML IJ SOLN
15.0000 mg | Freq: Once | INTRAMUSCULAR | Status: AC
  Administered 2021-08-27: 15 mg via INTRAVENOUS
  Filled 2021-08-27: qty 1

## 2021-08-27 MED ORDER — DIPHENHYDRAMINE HCL 50 MG/ML IJ SOLN
12.5000 mg | Freq: Once | INTRAMUSCULAR | Status: AC
  Administered 2021-08-27: 12.5 mg via INTRAVENOUS
  Filled 2021-08-27: qty 1

## 2021-08-27 NOTE — ED Provider Notes (Signed)
? ?[facility] ?Provider Note ? ? ? Event Date/Time  ? First MD Initiated Contact with Patient 08/27/21 1236   ?  (approximate) ? ? ?History  ? ?Blurred Vision and Headache ? ? ?HPI ? ?OCTAVIE Hancock is a 46 y.o. female with a history of headaches presents to the ER for several days of persistent headache associated with some tingling on the right side of her feet that occurred earlier this morning.  Checked her blood pressure and it was significantly elevated.  No measured fevers no neck stiffness or pain.  Has had some intermittent blurry vision.  States her left eye has been red for more than a month.  Denies any pain.  No blurry vision in that eye. ?  ? ? ?Physical Exam  ? ?Triage Vital Signs: ?ED Triage Vitals  ?Enc Vitals Group  ?   BP 08/27/21 1202 (!) 154/94  ?   Pulse Rate 08/27/21 1202 88  ?   Resp 08/27/21 1202 18  ?   Temp 08/27/21 1202 98.2 ?F (36.8 ?C)  ?   Temp Source 08/27/21 1202 Oral  ?   SpO2 08/27/21 1202 93 %  ?   Weight 08/27/21 1203 247 lb (112 kg)  ?   Height 08/27/21 1203 '5\' 2"'$  (1.575 m)  ?   Head Circumference --   ?   Peak Flow --   ?   Pain Score 08/27/21 1203 8  ?   Pain Loc --   ?   Pain Edu? --   ?   Excl. in [place]? --   ? ? ?Most recent vital signs: ?Vitals:  ? 08/27/21 1202  ?BP: (!) 154/94  ?Pulse: 88  ?Resp: 18  ?Temp: 98.2 ?F (36.8 ?C)  ?SpO2: 93%  ? ? ? ?Constitutional: Alert  ?Eyes: Conjunctivae are normal.  ?Head: Atraumatic. ?Nose: No congestion/rhinnorhea. ?Mouth/Throat: Mucous membranes are moist.   ?Neck: Painless ROM.  ?Cardiovascular:   Good peripheral circulation. ?Respiratory: Normal respiratory effort.  No retractions.  ?Gastrointestinal: Soft and nontender.  ?Musculoskeletal:  no deformity ?Neurologic:  CN- intact.  No facial droop, Normal FNF.  Normal heel to shin.  Sensation intact bilaterally. Normal speech and language. No gross focal neurologic deficits are appreciated. No gait instability. ?Skin:  Skin is warm, dry and intact. No rash  noted. ?Psychiatric: Mood and affect are normal. Speech and behavior are normal. ? ? ? ?ED Results / Procedures / Treatments  ? ?Labs ?(all labs ordered are listed, but only abnormal results are displayed) ?Labs Reviewed  ?COMPREHENSIVE METABOLIC PANEL - Abnormal; Notable for the following components:  ?    Result Value  ? Sodium 134 (*)   ? Glucose, Bld 258 (*)   ? Calcium 8.7 (*)   ? Albumin 3.3 (*)   ? All other components within normal limits  ?CBC  ? ? ? ?EKG ?ED ECG REPORT ?I, Merlyn Lot, the attending physician, personally viewed and interpreted this ECG. ? ? Date: 08/27/2021 ? EKG Time: 12:04 ? Rate: 95 ? Rhythm: sinus ? Axis: normal ? Intervals:rbbb ? ST&T Change: no stemi, no depressions ? ? ? ? ?RADIOLOGY ?Please see ED Course for my review and interpretation. ? ?I personally reviewed all radiographic images ordered to evaluate for the above acute complaints and reviewed radiology reports and findings.  These findings were personally discussed with the patient.  Please see medical record for radiology report. ? ? ? ?PROCEDURES: ? ?Critical Care performed: No ? ?Procedures ? ? ?  MEDICATIONS ORDERED IN ED: ?Medications  ?ketorolac (TORADOL) 30 MG/ML injection 15 mg (has no administration in time range)  ?prochlorperazine (COMPAZINE) injection 10 mg (10 mg Intravenous Given 08/27/21 1301)  ?diphenhydrAMINE (BENADRYL) injection 12.5 mg (12.5 mg Intravenous Given 08/27/21 1302)  ?acetaminophen (TYLENOL) tablet 650 mg (650 mg Oral Given 08/27/21 1301)  ? ? ? ?IMPRESSION / MDM / ASSESSMENT AND PLAN / ED COURSE  ?I reviewed the triage vital signs and the nursing notes. ?             ?               ? ?Differential diagnosis includes, but is not limited to, migraine, tension, CVA, TIA, mass, SDH, IVH ? ?Patient presenting with symptoms as described above.  Nontoxic-appearing has history of recurrent headache suspect migraine but given symptoms of tingling in the right lower extremity concerning for possible TIA.   Probable complex migraine given her history and presentation will treat for migraine with migraine cocktail.  Will order MRI to further evaluate for the above differential. ? ?Clinical Course as of 08/27/21 1420  ?Tue Aug 27, 2021  ?1349 MRI without any evidence of mass.  Findings most consistent with chronic and recurrent migraines given her history and presentation. [PR]  ?1419 IOP 20.  Not consistent with glaucoma.  Patient feels improved.  Discussed mildly elevated glucose level and need for outpatient follow-up.  Patient otherwise stable and appropriate for discharge [PR]  ?  ?Clinical Course User Index ?[PR] Merlyn Lot, MD  ? ? ? ?FINAL CLINICAL IMPRESSION(S) / ED DIAGNOSES  ? ?Final diagnoses:  ?Bad headache  ? ? ? ?Rx / DC Orders  ? ?ED Discharge Orders   ? ? None  ? ?  ? ? ? ?Note:  This document was prepared using Dragon voice recognition software and may include unintentional dictation errors. ? ?  ?Merlyn Lot, MD ?08/27/21 1421 ? ?

## 2021-08-27 NOTE — Discharge Instructions (Signed)

## 2021-08-27 NOTE — ED Notes (Signed)
Pt evaluated by stroke coordinator. Pt's symptoms not conducive for a code stroke alert. ?

## 2021-08-27 NOTE — ED Notes (Signed)
Triage RN requested this RN to assess patient from stroke coordinator perspective. Pt reports to this RN that she has been having headache x 13 days that worsened this am and she began to have bilt blurred vision and generalized weakness. No deficits noted in assessing patient. A/O x 4. See [facility] for documentation.  ?

## 2021-08-27 NOTE — ED Triage Notes (Signed)
Pt here with blurred vision and a headache that started at 0930. Pt also c/o left eye redness that has been there for a while. Pt has weakness and dizziness. Pt states pin prickly feeling in the top of her toes on both feet. Pt has no facial droop. ?

## 2021-08-27 NOTE — ED Notes (Signed)
Pt reports h/a x13 days. States BC did not help. Excedrin migraine medication helped a little. Headache on the right base of the head radiating to right eye. Pt states left arm redness for a few days.   ?

## 2022-02-20 IMAGING — US US PELVIS COMPLETE WITH TRANSVAGINAL
1 series · 13 of 25 positions shown · non-contrast
Comparison: Pelvic ultrasound 05/13/2017

CLINICAL DATA: Abnormal vaginal bleeding.

EXAM:
TRANSABDOMINAL AND TRANSVAGINAL ULTRASOUND OF PELVIS
TECHNIQUE: Both transabdominal and transvaginal ultrasound examinations of the
pelvis were performed. Transabdominal technique was performed for
global imaging of the pelvis including uterus, ovaries, adnexal
regions, and pelvic cul-de-sac. It was necessary to proceed with
endovaginal exam following the transabdominal exam to visualize the
endometrium and adnexa.

[Series 1: us pelvic complete with transvaginal · 13 of 78 slices shown]
[im 1/78]
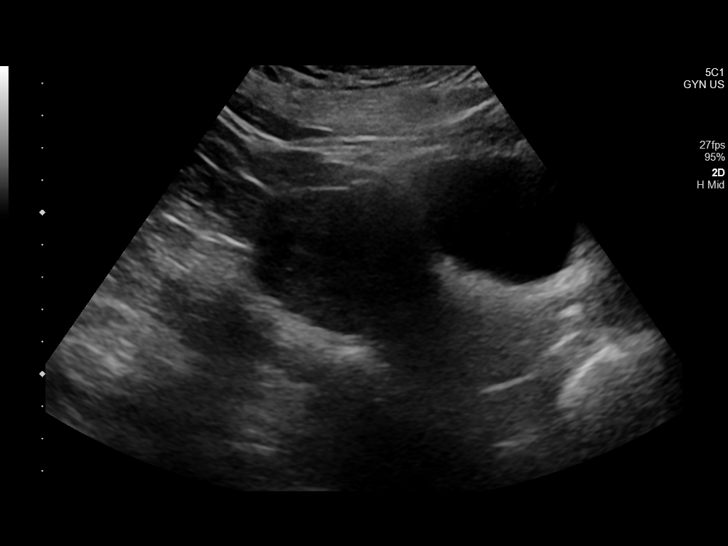
[im 7/78]
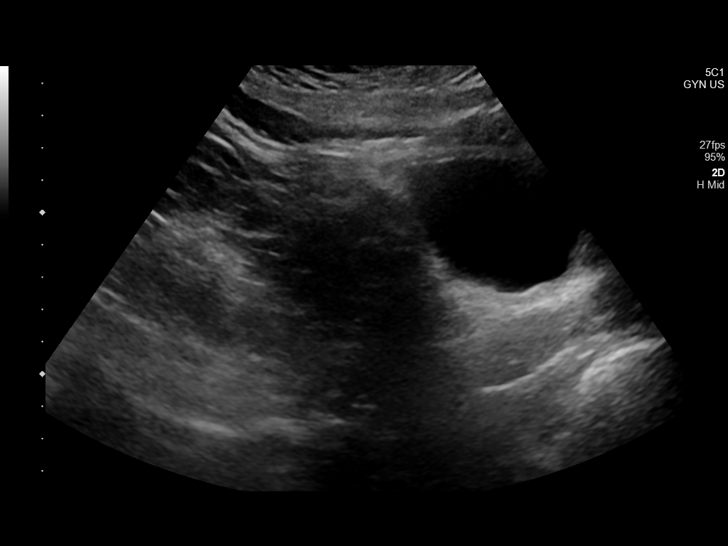
[im 13/78]
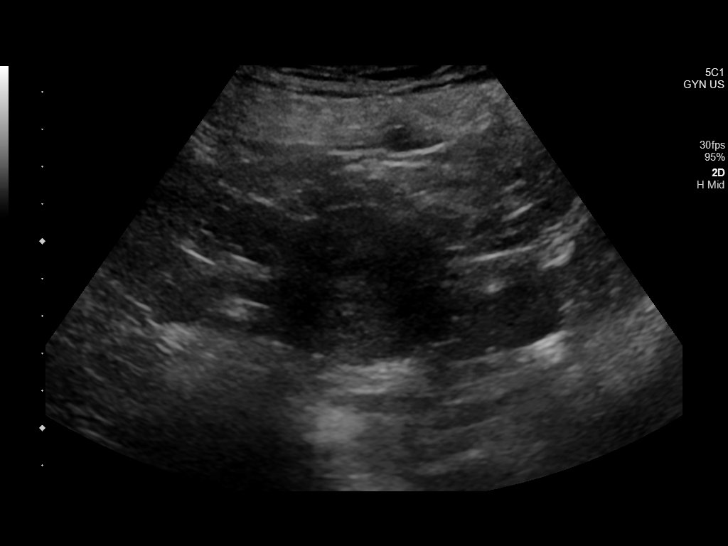
[im 20/78]
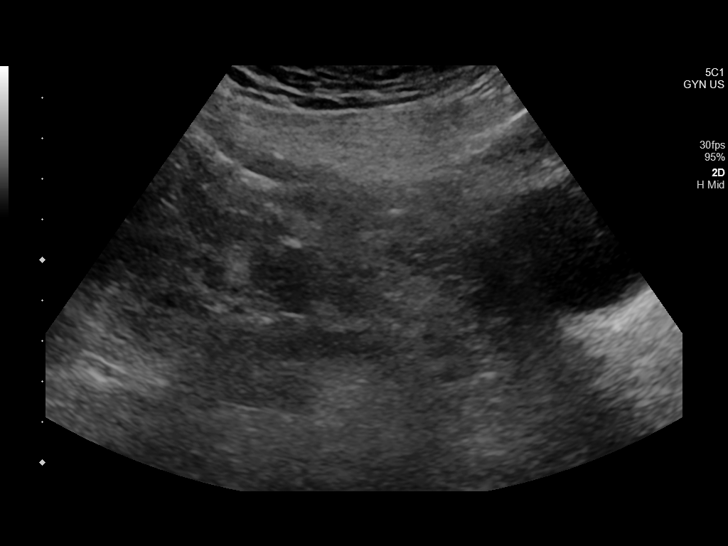
[im 26/78]
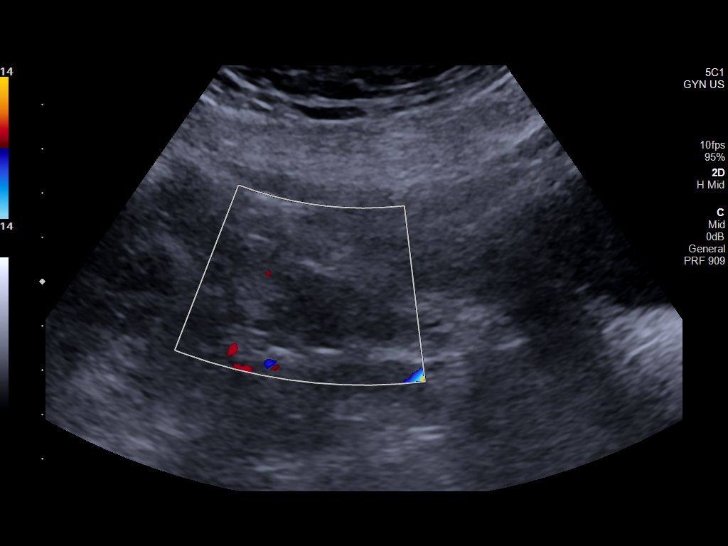
[im 33/78]
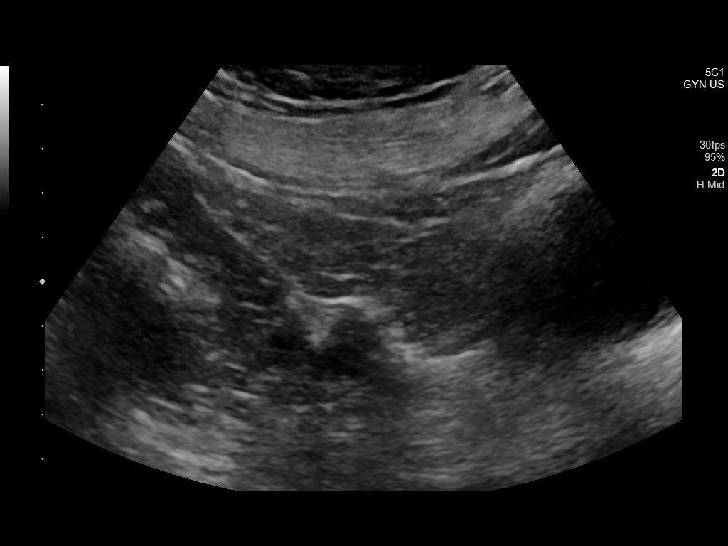
[im 39/78]
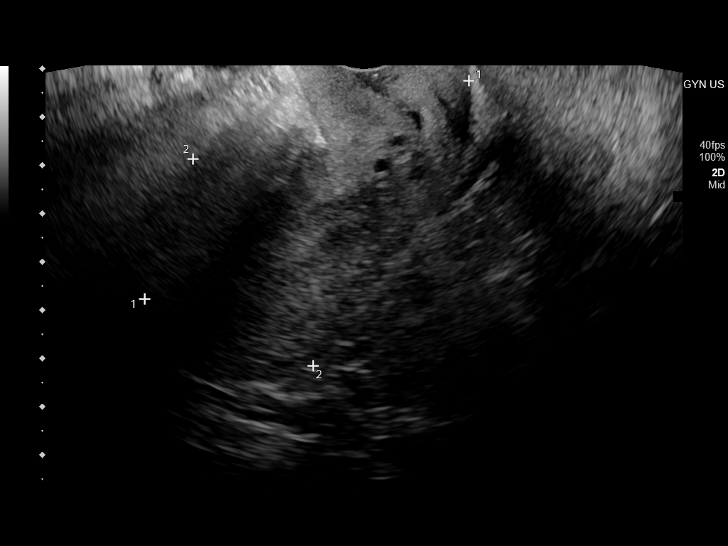
[im 45/78]
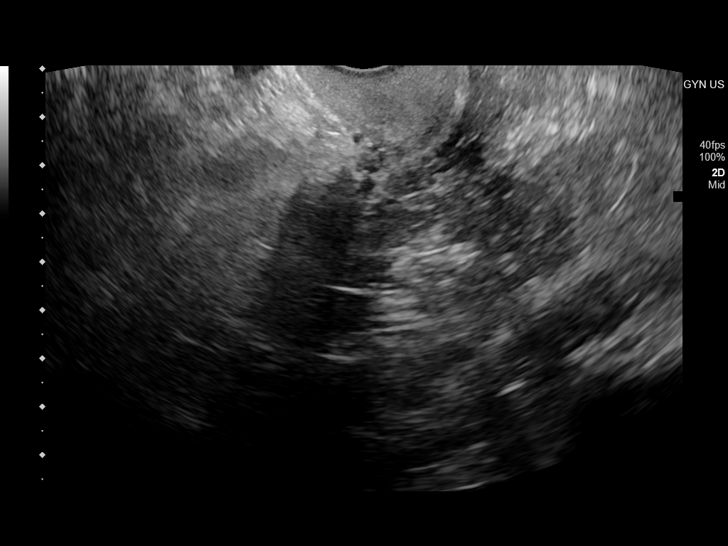
[im 52/78]
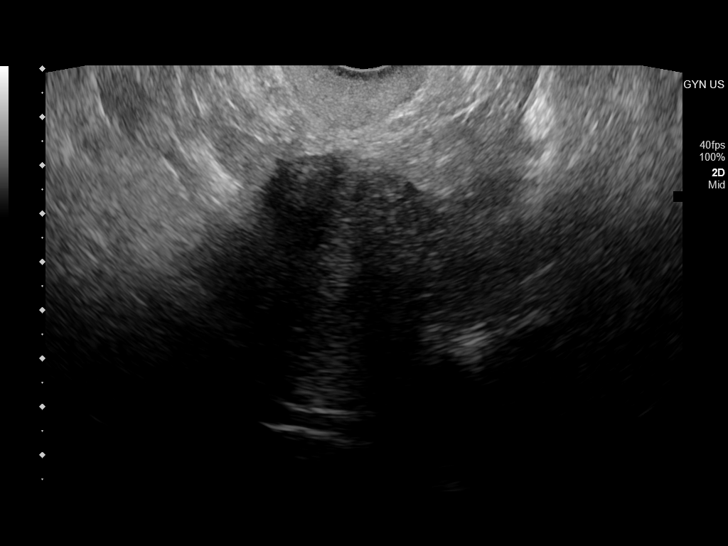
[im 58/78]
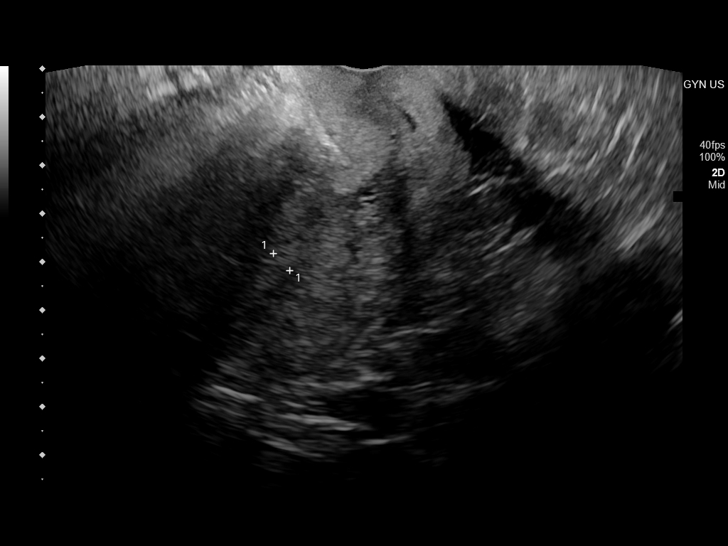
[im 65/78]
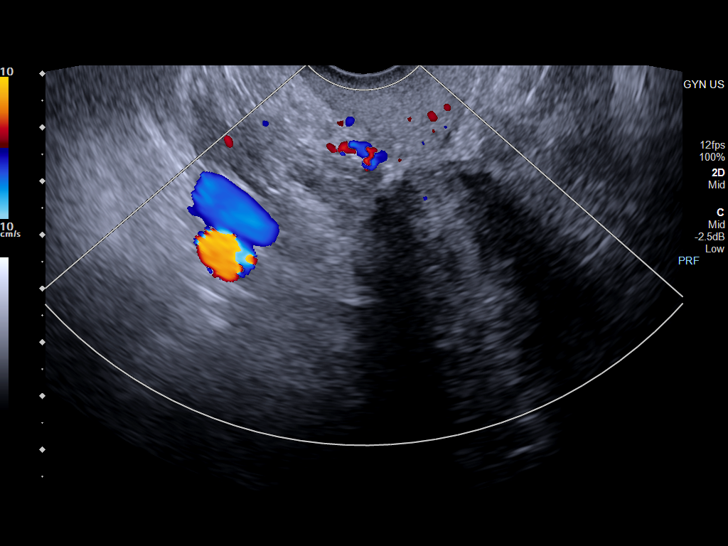
[im 71/78]
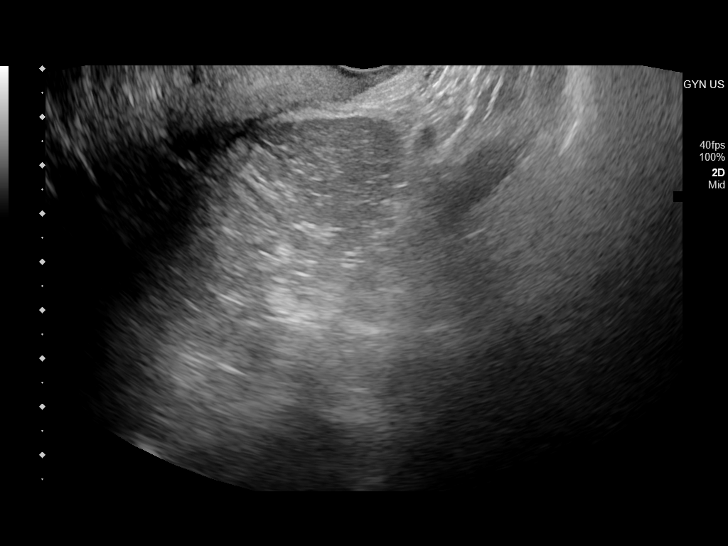
[im 78/78]
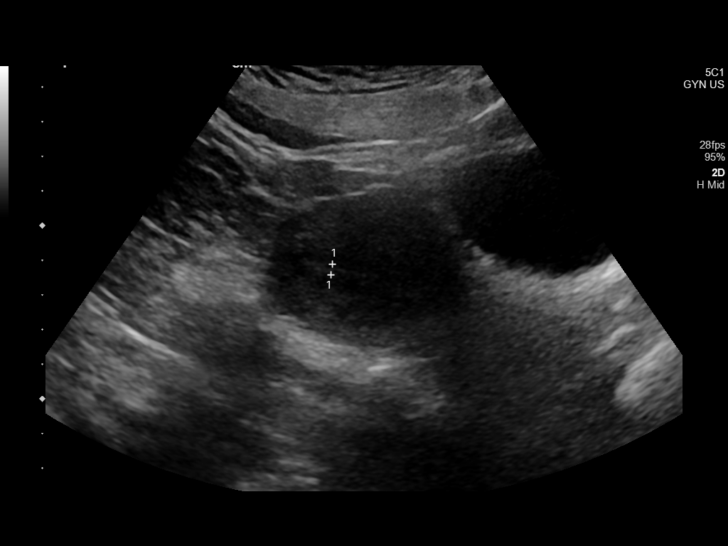

[13 of 25 positions shown; findings below may reference images not displayed]

FINDINGS: Uterus

Measurements: 8.1 x 5.0 x 4.3 cm = volume: 90 mL. Hypoechoic
subserosal fibroid in the anterior fundus measuring 2.7 x 2.5 x
cm, similar appearance to prior exam.

Endometrium

Thickness: 5 mm, normal.  No focal abnormality visualized.

Right ovary

Measurements: 2.4 x 1.6 x 2.6 cm = volume: 5 mL. Normal appearance
with blood flow. No ovarian or adnexal mass.

Left ovary

Measurements: 2.3 x 1.6 x 1.8 cm = volume: 3 mL. Normal appearance
with blood flow. No ovarian or adnexal mass.

Other findings

No abnormal free fluid.
IMPRESSION: 1. Subserosal uterine fibroid in the anterior fundus measuring
cm, similar in appearance to prior exam.
2. Normal sonographic appearance of the endometrium and ovaries. If
bleeding remains unresponsive to hormonal or medical therapy,
sonohysterogram should be considered for focal lesion work-up. (Ref:
Radiological Reasoning: Algorithmic Workup of Abnormal Vaginal
Bleeding with Endovaginal Sonography and Sonohysterography. AJR
[id number]; [id number])
